# Patient Record
Sex: Female | Born: 1995 | Race: White | Hispanic: No | Marital: Single | Smoking: Current every day smoker
Health system: Southern US, Community
[De-identification: ages and names within clinical notes are randomized; demographics above are authoritative.]

## PROBLEM LIST (undated history)

## (undated) DIAGNOSIS — Z8742 Personal history of other diseases of the female genital tract: Secondary | ICD-10-CM

## (undated) DIAGNOSIS — F4310 Post-traumatic stress disorder, unspecified: Secondary | ICD-10-CM

## (undated) DIAGNOSIS — G40909 Epilepsy, unspecified, not intractable, without status epilepticus: Secondary | ICD-10-CM

## (undated) DIAGNOSIS — Z8619 Personal history of other infectious and parasitic diseases: Secondary | ICD-10-CM

## (undated) DIAGNOSIS — Z87440 Personal history of urinary (tract) infections: Secondary | ICD-10-CM

## (undated) DIAGNOSIS — R569 Unspecified convulsions: Secondary | ICD-10-CM

## (undated) HISTORY — DX: Personal history of other infectious and parasitic diseases: Z86.19

## (undated) HISTORY — DX: Epilepsy, unspecified, not intractable, without status epilepticus: G40.909

## (undated) HISTORY — DX: Personal history of urinary (tract) infections: Z87.440

## (undated) HISTORY — PX: KNEE SURGERY: SHX244

## (undated) HISTORY — DX: Personal history of other diseases of the female genital tract: Z87.42

---

## 2016-08-07 ENCOUNTER — Emergency Department
Admission: EM | Admit: 2016-08-07 | Discharge: 2016-08-07 | Disposition: A | Discharge status: Home / Self Care | Payer: Self-pay | Attending: Emergency Medicine | Admitting: Emergency Medicine

## 2016-08-07 ENCOUNTER — Encounter: Payer: Self-pay | Admitting: Emergency Medicine

## 2016-08-07 DIAGNOSIS — S0501XA Injury of conjunctiva and corneal abrasion without foreign body, right eye, initial encounter: Secondary | ICD-10-CM | POA: Insufficient documentation

## 2016-08-07 DIAGNOSIS — Y999 Unspecified external cause status: Secondary | ICD-10-CM | POA: Insufficient documentation

## 2016-08-07 DIAGNOSIS — H109 Unspecified conjunctivitis: Secondary | ICD-10-CM

## 2016-08-07 DIAGNOSIS — Y939 Activity, unspecified: Secondary | ICD-10-CM | POA: Insufficient documentation

## 2016-08-07 DIAGNOSIS — F17200 Nicotine dependence, unspecified, uncomplicated: Secondary | ICD-10-CM | POA: Insufficient documentation

## 2016-08-07 DIAGNOSIS — S0081XA Abrasion of other part of head, initial encounter: Secondary | ICD-10-CM

## 2016-08-07 DIAGNOSIS — H1089 Other conjunctivitis: Secondary | ICD-10-CM | POA: Insufficient documentation

## 2016-08-07 DIAGNOSIS — W1809XA Striking against other object with subsequent fall, initial encounter: Secondary | ICD-10-CM | POA: Insufficient documentation

## 2016-08-07 DIAGNOSIS — Y929 Unspecified place or not applicable: Secondary | ICD-10-CM | POA: Insufficient documentation

## 2016-08-07 MED ORDER — KETOROLAC TROMETHAMINE 30 MG/ML IJ SOLN
30.0000 mg | Freq: Once | INTRAMUSCULAR | Status: AC
  Administered 2016-08-07: 30 mg via INTRAMUSCULAR
  Filled 2016-08-07: qty 1

## 2016-08-07 MED ORDER — CEPHALEXIN 500 MG PO CAPS
500.0000 mg | ORAL_CAPSULE | Freq: Once | ORAL | Status: AC
  Administered 2016-08-07: 500 mg via ORAL
  Filled 2016-08-07: qty 1

## 2016-08-07 MED ORDER — FLUORESCEIN SODIUM 0.6 MG OP STRP
1.0000 | ORAL_STRIP | Freq: Once | OPHTHALMIC | Status: AC
  Administered 2016-08-07: 1 via OPHTHALMIC
  Filled 2016-08-07: qty 1

## 2016-08-07 MED ORDER — OFLOXACIN 0.3 % OP SOLN: 1.0000 [drp] | Freq: Four times a day (QID) | OPHTHALMIC | 0 refills | Status: AC

## 2016-08-07 MED ORDER — OFLOXACIN 0.3 % OP SOLN
1.0000 [drp] | Freq: Once | OPHTHALMIC | Status: AC
  Administered 2016-08-07: 1 [drp] via OPHTHALMIC
  Filled 2016-08-07: qty 5

## 2016-08-07 MED ORDER — CEPHALEXIN 500 MG PO TABS: 500.0000 mg | ORAL_TABLET | Freq: Four times a day (QID) | ORAL | 0 refills | Status: AC

## 2016-08-07 MED ORDER — TETRACAINE HCL 0.5 % OP SOLN
1.0000 [drp] | Freq: Once | OPHTHALMIC | Status: AC
  Administered 2016-08-07: 1 [drp] via OPHTHALMIC
  Filled 2016-08-07: qty 2
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
## 2016-08-07 NOTE — ED Notes (Signed)
Pharmacy notified to send Ocuflox drops.

## 2016-09-29 ENCOUNTER — Emergency Department
Admission: EM | Admit: 2016-09-29 | Discharge: 2016-09-29 | Disposition: A | Discharge status: Home / Self Care | Payer: Self-pay | Attending: Emergency Medicine | Admitting: Emergency Medicine

## 2016-09-29 ENCOUNTER — Encounter: Payer: Self-pay | Admitting: *Deleted

## 2016-09-29 DIAGNOSIS — E876 Hypokalemia: Secondary | ICD-10-CM | POA: Insufficient documentation

## 2016-09-29 DIAGNOSIS — F17210 Nicotine dependence, cigarettes, uncomplicated: Secondary | ICD-10-CM | POA: Insufficient documentation

## 2016-09-29 DIAGNOSIS — R5383 Other fatigue: Secondary | ICD-10-CM | POA: Insufficient documentation

## 2016-09-29 LAB — URINALYSIS, COMPLETE (UACMP) WITH MICROSCOPIC
BILIRUBIN URINE: NEGATIVE
Bacteria, UA: NONE SEEN
Glucose, UA: NEGATIVE mg/dL
Hgb urine dipstick: NEGATIVE
Ketones, ur: 5 mg/dL — AB
NITRITE: NEGATIVE
PH: 7 (ref 5.0–8.0)
Protein, ur: 100 mg/dL — AB
Specific Gravity, Urine: 1.026 (ref 1.005–1.030)

## 2016-09-29 LAB — BASIC METABOLIC PANEL
Anion gap: 13 (ref 5–15)
BUN: 8 mg/dL (ref 6–20)
CALCIUM: 9.6 mg/dL (ref 8.9–10.3)
CO2: 20 mmol/L — AB (ref 22–32)
CREATININE: 0.82 mg/dL (ref 0.44–1.00)
Chloride: 106 mmol/L (ref 101–111)
Glucose, Bld: 77 mg/dL (ref 65–99)
Potassium: 2.6 mmol/L — CL (ref 3.5–5.1)
SODIUM: 139 mmol/L (ref 135–145)

## 2016-09-29 LAB — CBC
HCT: 41.7 % (ref 35.0–47.0)
Hemoglobin: 14.1 g/dL (ref 12.0–16.0)
MCH: 30.8 pg (ref 26.0–34.0)
MCHC: 33.9 g/dL (ref 32.0–36.0)
MCV: 90.8 fL (ref 80.0–100.0)
PLATELETS: 361 10*3/uL (ref 150–440)
RBC: 4.59 MIL/uL (ref 3.80–5.20)
RDW: 15.3 % — ABNORMAL HIGH (ref 11.5–14.5)
WBC: 10.5 10*3/uL (ref 3.6–11.0)

## 2016-09-29 LAB — MAGNESIUM: Magnesium: 2.1 mg/dL (ref 1.7–2.4)

## 2016-09-29 LAB — POCT PREGNANCY, URINE: PREG TEST UR: NEGATIVE

## 2016-09-29 MED ORDER — POTASSIUM CHLORIDE 10 MEQ/100ML IV SOLN
10.0000 meq | INTRAVENOUS | Status: AC
  Administered 2016-09-29 (×2): 10 meq via INTRAVENOUS
  Filled 2016-09-29 (×2): qty 100

## 2016-09-29 MED ORDER — POTASSIUM CHLORIDE CRYS ER 20 MEQ PO TBCR
40.0000 meq | EXTENDED_RELEASE_TABLET | Freq: Once | ORAL | Status: AC
  Administered 2016-09-29: 40 meq via ORAL
  Filled 2016-09-29: qty 2

## 2016-09-29 NOTE — Discharge Instructions (Addendum)
You were evaluated for muscle cramping, and found to have low potassium. You were given potassium replacement here in the emergency department.  The rest of the exam is reassuring in the emergency department today. Return to the emergency room immediately for any worsening condition including weakness, numbness, slurred speech, trouble finding her words, confusion or altered mental status, fever, neck stiffness, chest pain, trouble breathing, or any other symptoms concerning to you.

## 2016-09-29 NOTE — ED Notes (Signed)
Potassium of 2.6 verbally given to Dr Cyril LoosenKinner. MD acknowledged. No orders given at this time.

## 2016-09-29 NOTE — ED Notes (Signed)
IV placed and full rainbow sent to lab.
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
## 2016-09-30 ENCOUNTER — Encounter: Payer: Self-pay | Admitting: Emergency Medicine

## 2016-09-30 ENCOUNTER — Emergency Department
Admission: EM | Admit: 2016-09-30 | Discharge: 2016-10-01 | Disposition: A | Discharge status: Home / Self Care | Payer: Self-pay | Attending: Emergency Medicine | Admitting: Emergency Medicine

## 2016-09-30 ENCOUNTER — Emergency Department: Payer: Self-pay

## 2016-09-30 DIAGNOSIS — F17210 Nicotine dependence, cigarettes, uncomplicated: Secondary | ICD-10-CM | POA: Insufficient documentation

## 2016-09-30 DIAGNOSIS — F10920 Alcohol use, unspecified with intoxication, uncomplicated: Secondary | ICD-10-CM | POA: Insufficient documentation

## 2016-09-30 DIAGNOSIS — Y9389 Activity, other specified: Secondary | ICD-10-CM | POA: Insufficient documentation

## 2016-09-30 DIAGNOSIS — Y929 Unspecified place or not applicable: Secondary | ICD-10-CM | POA: Insufficient documentation

## 2016-09-30 DIAGNOSIS — S301XXA Contusion of abdominal wall, initial encounter: Secondary | ICD-10-CM | POA: Insufficient documentation

## 2016-09-30 DIAGNOSIS — S0101XA Laceration without foreign body of scalp, initial encounter: Secondary | ICD-10-CM | POA: Insufficient documentation

## 2016-09-30 DIAGNOSIS — Y999 Unspecified external cause status: Secondary | ICD-10-CM | POA: Insufficient documentation

## 2016-09-30 HISTORY — DX: Post-traumatic stress disorder, unspecified: F43.10

## 2016-09-30 LAB — CBC
HEMATOCRIT: 43.3 % (ref 35.0–47.0)
HEMOGLOBIN: 14.4 g/dL (ref 12.0–16.0)
MCH: 31 pg (ref 26.0–34.0)
MCHC: 33.3 g/dL (ref 32.0–36.0)
MCV: 93.2 fL (ref 80.0–100.0)
Platelets: 389 10*3/uL (ref 150–440)
RBC: 4.65 MIL/uL (ref 3.80–5.20)
RDW: 15.8 % — ABNORMAL HIGH (ref 11.5–14.5)
WBC: 11.4 10*3/uL — ABNORMAL HIGH (ref 3.6–11.0)

## 2016-09-30 MED ORDER — LORAZEPAM 2 MG/ML IJ SOLN
1.0000 mg | Freq: Once | INTRAMUSCULAR | Status: AC
Start: 2016-09-30 — End: 2016-09-30
  Administered 2016-09-30: 1 mg via INTRAVENOUS

## 2016-09-30 MED ORDER — IOPAMIDOL (ISOVUE-300) INJECTION 61%
100.0000 mL | Freq: Once | INTRAVENOUS | Status: AC | PRN
  Administered 2016-09-30: 100 mL via INTRAVENOUS

## 2016-09-30 MED ORDER — LORAZEPAM 2 MG/ML IJ SOLN
INTRAMUSCULAR | Status: AC
  Filled 2016-09-30: qty 1

## 2016-09-30 MED ORDER — LORAZEPAM 2 MG/ML IJ SOLN
1.0000 mg | Freq: Once | INTRAMUSCULAR | Status: AC
  Administered 2016-09-30: 1 mg via INTRAVENOUS

## 2016-09-30 MED ORDER — DIPHENHYDRAMINE HCL 50 MG/ML IJ SOLN
INTRAMUSCULAR | Status: AC
  Filled 2016-09-30: qty 1

## 2016-09-30 MED ORDER — HALOPERIDOL LACTATE 5 MG/ML IJ SOLN
INTRAMUSCULAR | Status: AC
  Filled 2016-09-30: qty 1
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
## 2016-10-01 LAB — COMPREHENSIVE METABOLIC PANEL
ALBUMIN: 3.7 g/dL (ref 3.5–5.0)
ALT: 20 U/L (ref 14–54)
ANION GAP: 8 (ref 5–15)
AST: 22 U/L (ref 15–41)
Alkaline Phosphatase: 44 U/L (ref 38–126)
BUN: 8 mg/dL (ref 6–20)
CHLORIDE: 110 mmol/L (ref 101–111)
CO2: 23 mmol/L (ref 22–32)
Calcium: 8.7 mg/dL — ABNORMAL LOW (ref 8.9–10.3)
Creatinine, Ser: 0.74 mg/dL (ref 0.44–1.00)
GFR calc Af Amer: >60 mL/min (ref >60)
GFR calc non Af Amer: >60 mL/min (ref >60)
GLUCOSE: 97 mg/dL (ref 65–99)
POTASSIUM: 3.4 mmol/L — AB (ref 3.5–5.1)
SODIUM: 141 mmol/L (ref 135–145)
Total Bilirubin: 0.2 mg/dL — ABNORMAL LOW (ref 0.3–1.2)
Total Protein: 6.9 g/dL (ref 6.5–8.1)

## 2016-10-01 LAB — ETHANOL: Alcohol, Ethyl (B): 246 mg/dL — ABNORMAL HIGH (ref <5)

## 2016-10-01 MED ORDER — LIDOCAINE HCL (PF) 1 % IJ SOLN
INTRAMUSCULAR | Status: AC
  Administered 2016-10-01: 02:00:00
  Filled 2016-10-01: qty 5

## 2016-10-01 NOTE — ED Notes (Signed)
Cops at bedside

## 2016-10-01 NOTE — ED Notes (Signed)
Pt roommate Tamara Barron(Maria) left phone numbers to call when she is sober enough to leave hospital.  [phone] cell [phone] cell [phone] store after 10am

## 2016-10-17 ENCOUNTER — Encounter: Payer: Self-pay | Admitting: Emergency Medicine

## 2016-10-17 ENCOUNTER — Emergency Department
Admission: EM | Admit: 2016-10-17 | Discharge: 2016-10-17 | Disposition: A | Discharge status: Home / Self Care | Payer: Self-pay | Attending: Emergency Medicine | Admitting: Emergency Medicine

## 2016-10-17 DIAGNOSIS — F17210 Nicotine dependence, cigarettes, uncomplicated: Secondary | ICD-10-CM | POA: Insufficient documentation

## 2016-10-17 DIAGNOSIS — Z4802 Encounter for removal of sutures: Secondary | ICD-10-CM | POA: Insufficient documentation
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
## 2016-10-17 NOTE — ED Triage Notes (Signed)
Pt here for staple removal. Placed 2-2.5 weeks ago. No problems a/w staples. 2 have come out on own per pt.

## 2017-08-29 ENCOUNTER — Emergency Department
Admission: EM | Admit: 2017-08-29 | Discharge: 2017-08-29 | Disposition: A | Discharge status: Home / Self Care | Payer: Self-pay | Attending: Emergency Medicine | Admitting: Emergency Medicine

## 2017-08-29 ENCOUNTER — Encounter: Payer: Self-pay | Admitting: Emergency Medicine

## 2017-08-29 DIAGNOSIS — F1910 Other psychoactive substance abuse, uncomplicated: Secondary | ICD-10-CM | POA: Insufficient documentation

## 2017-08-29 DIAGNOSIS — S61519S Laceration without foreign body of unspecified wrist, sequela: Secondary | ICD-10-CM

## 2017-08-29 DIAGNOSIS — Y999 Unspecified external cause status: Secondary | ICD-10-CM | POA: Insufficient documentation

## 2017-08-29 DIAGNOSIS — X781XXS Intentional self-harm by knife, sequela: Secondary | ICD-10-CM | POA: Insufficient documentation

## 2017-08-29 DIAGNOSIS — Y9389 Activity, other specified: Secondary | ICD-10-CM | POA: Insufficient documentation

## 2017-08-29 DIAGNOSIS — S61512S Laceration without foreign body of left wrist, sequela: Secondary | ICD-10-CM | POA: Insufficient documentation

## 2017-08-29 DIAGNOSIS — Y929 Unspecified place or not applicable: Secondary | ICD-10-CM | POA: Insufficient documentation

## 2017-08-29 DIAGNOSIS — F17210 Nicotine dependence, cigarettes, uncomplicated: Secondary | ICD-10-CM | POA: Insufficient documentation

## 2017-08-29 DIAGNOSIS — Z23 Encounter for immunization: Secondary | ICD-10-CM | POA: Insufficient documentation

## 2017-08-29 DIAGNOSIS — X789XXS Intentional self-harm by unspecified sharp object, sequela: Secondary | ICD-10-CM

## 2017-08-29 DIAGNOSIS — S61511S Laceration without foreign body of right wrist, sequela: Secondary | ICD-10-CM | POA: Insufficient documentation

## 2017-08-29 DIAGNOSIS — R45851 Suicidal ideations: Secondary | ICD-10-CM | POA: Insufficient documentation

## 2017-08-29 HISTORY — DX: Unspecified convulsions: R56.9

## 2017-08-29 LAB — URINE DRUG SCREEN, QUALITATIVE (ARMC ONLY)
Amphetamines, Ur Screen: NOT DETECTED
Benzodiazepine, Ur Scrn: NOT DETECTED
Cannabinoid 50 Ng, Ur ~~LOC~~: POSITIVE — AB
Cocaine Metabolite,Ur ~~LOC~~: POSITIVE — AB
MDMA (Ecstasy)Ur Screen: NOT DETECTED
Methadone Scn, Ur: NOT DETECTED
Opiate, Ur Screen: NOT DETECTED
Phencyclidine (PCP) Ur S: NOT DETECTED
Tricyclic, Ur Screen: NOT DETECTED

## 2017-08-29 LAB — CBC
HEMATOCRIT: 43.9 % (ref 35.0–47.0)
HEMOGLOBIN: 14.6 g/dL (ref 12.0–16.0)
MCH: 31.8 pg (ref 26.0–34.0)
MCHC: 33.3 g/dL (ref 32.0–36.0)
MCV: 95.5 fL (ref 80.0–100.0)
Platelets: 384 10*3/uL (ref 150–440)
RBC: 4.6 MIL/uL (ref 3.80–5.20)
RDW: 14.1 % (ref 11.5–14.5)
WBC: 8.8 10*3/uL (ref 3.6–11.0)

## 2017-08-29 LAB — COMPREHENSIVE METABOLIC PANEL
ALT: 25 U/L (ref 14–54)
AST: 21 U/L (ref 15–41)
Albumin: 4.9 g/dL (ref 3.5–5.0)
Alkaline Phosphatase: 48 U/L (ref 38–126)
Anion gap: 9 (ref 5–15)
BUN: 6 mg/dL (ref 6–20)
CO2: 26 mmol/L (ref 22–32)
Calcium: 9.2 mg/dL (ref 8.9–10.3)
Chloride: 108 mmol/L (ref 101–111)
Creatinine, Ser: 0.84 mg/dL (ref 0.44–1.00)
GFR calc Af Amer: >60 mL/min (ref >60)
GFR calc non Af Amer: >60 mL/min (ref >60)
Glucose, Bld: 104 mg/dL — ABNORMAL HIGH (ref 65–99)
Potassium: 3.5 mmol/L (ref 3.5–5.1)
Sodium: 143 mmol/L (ref 135–145)
Total Bilirubin: 0.3 mg/dL (ref 0.3–1.2)
Total Protein: 8.7 g/dL — ABNORMAL HIGH (ref 6.5–8.1)

## 2017-08-29 LAB — HCG, QUANTITATIVE, PREGNANCY: hCG, Beta Chain, Quant, S: <1 m[IU]/mL (ref <5)

## 2017-08-29 LAB — SALICYLATE LEVEL: Salicylate Lvl: <7 mg/dL (ref 2.8–30.0)

## 2017-08-29 LAB — ETHANOL: Alcohol, Ethyl (B): 221 mg/dL — ABNORMAL HIGH (ref <10)

## 2017-08-29 LAB — ACETAMINOPHEN LEVEL: Acetaminophen (Tylenol), Serum: <10 ug/mL — ABNORMAL LOW (ref 10–30)

## 2017-08-29 MED ORDER — TETANUS-DIPHTH-ACELL PERTUSSIS 5-2.5-18.5 LF-MCG/0.5 IM SUSP
0.5000 mL | Freq: Once | INTRAMUSCULAR | Status: AC
  Administered 2017-08-29: 0.5 mL via INTRAMUSCULAR
  Filled 2017-08-29: qty 0.5

## 2017-08-29 NOTE — ED Notes (Signed)
Pt ambulatory to interview room to have SOC done. Tamara Barron EDT and ODS officer in room with pt. Pt informed about SOC process and verbalized understanding.

## 2017-08-29 NOTE — ED Notes (Addendum)
Pt informed she is being DC. Pt given 1 pt belongings bag back to change in bathroom. States she has a number on her phone to call for sober ride. Will await for ride to arrive before DC pt.

## 2017-08-29 NOTE — BH Assessment (Signed)
This Clinical research associatewriter called ED Dr. Don PerkingVeronese and asked if she wants TTS to see patient and she said no, just psych for clearance.

## 2017-08-29 NOTE — ED Notes (Signed)
SOC complete at this time.

## 2017-08-29 NOTE — ED Notes (Signed)
Pt asking when she can use the phone. Art therapistAmber RN informed pt that she could use a hospital phone at 5 when phone hours are open. She asked about her own phone and informed that policy is that her personal phone is locked away but she would be abel to use a hospital phone. Pt then stated "it's a 25 mile walk to my house."
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
## 2017-08-29 NOTE — ED Notes (Signed)
Dr. Veronese at bedside 

## 2017-08-29 NOTE — ED Notes (Signed)
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 Pt has septum piercing in and nipple rings. States that she paid $50 to have areas pierced and that they can't be removed. States that if we cut them off her then we owe her $50. Art therapistAmber RN explained to pt process of having psych MD evaluate her and that we have one psych MD at this hospital. Pt states "this is bullshit. You'd think a civilian hospital would have more than one doctor." officers at bedside talking to pt as well.
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
## 2017-08-29 NOTE — ED Triage Notes (Signed)
Patient presents to the ED with IVC papers.  Patient states, "I tried to kill myself, but in my defense, I didn't get too far."  Patient is calm and cooperative at this time.

## 2017-08-29 NOTE — ED Notes (Signed)
First Nurse Note: Pt to ED with MPD under IVC. Pt in NAD at this time.

## 2017-08-29 NOTE — ED Provider Notes (Signed)
[facility] Emergency Department Provider Note  ____________________________________________  Time seen: Approximately 5:02 PM  I have reviewed the triage vital signs and the nursing notes.   HISTORY  Chief Complaint Suicidal   HPI Tamara Barron is a 22 y.o. female with a history of seizure and PTSD who presents IVC for suicidal ideation.  According to IVC papers "[place] police officers received a phone call from a female attempting to cut herself with a knife.  When they arrive the responded will not give her name, had several cuts in both arms.  Responded stated to officers that she wanted to die.  Responded stated that she has just been diagnosed with stage IV cancer and only has 2 years to live.  Respondent's boyfriend stated that this was not true that responded lies to get attention from people.  Responded will be transported to [facility] for evaluation."  In the emergency department patient says that she had too much to drink and her emotions got out.  She denies being suicidal.  She denies any prior history of suicidal ideation or suicide attempts.  She denies any prior admission to psychiatric facilities.  She  denies history of depression. She reports that she drinks on a daily basis.  She denies any other drug use.   Chief Complaint: suicidal ideation Severity: severe Duration: today Context: in the setting of alcohol abuse Modifying factors: worse with drinking Associated signs/symptoms: patient denies HI or depression  Past Medical History:  Diagnosis Date  . PTSD (post-traumatic stress disorder)   . Seizures (HCC)    epilepsy    Past Surgical History:  Procedure Laterality Date  . KNEE SURGERY      Prior to Admission medications   Not on File    Allergies Hydrocodone and Oxycodone  FH None  Social History Social History   Tobacco Use  . Smoking status: Current Every Day Smoker    Packs/day: 0.50    Types: Cigarettes   . Smokeless tobacco: Never Used  Substance Use Topics  . Alcohol use: Yes    Comment: daily--"a lot"-a 5th probably  . Drug use: No    Review of Systems  Constitutional: Negative for fever. Eyes: Negative for visual changes. ENT: Negative for sore throat. Neck: No neck pain  Cardiovascular: Negative for chest pain. Respiratory: Negative for shortness of breath. Gastrointestinal: Negative for abdominal pain, vomiting or diarrhea. Genitourinary: Negative for dysuria. Musculoskeletal: Negative for back pain. Skin: Negative for rash. Neurological: Negative for headaches, weakness or numbness. Psych: + SI. No HI  ____________________________________________   PHYSICAL EXAM:  VITAL SIGNS: ED Triage Vitals  Enc Vitals Group     BP 08/29/17 1535 134/89     Pulse Rate 08/29/17 1535 95     Resp 08/29/17 1535 20     Temp 08/29/17 1535 98.3 F (36.8 C)     Temp Source 08/29/17 1535 Oral     SpO2 08/29/17 1535 100 %     Weight 08/29/17 1600 168 lb (76.2 kg)     Height 08/29/17 1600 5' 6.5" (1.689 m)     Head Circumference --      Peak Flow --      Pain Score 08/29/17 1600 0     Pain Loc --      Pain Edu? --      Excl. in GC? --     Constitutional: Alert and oriented. Well appearing and in no apparent distress. HEENT:  Head: Normocephalic and atraumatic.         Eyes: Conjunctivae are normal. Sclera is non-icteric.       Mouth/Throat: Mucous membranes are moist.       Neck: Supple with no signs of meningismus. Cardiovascular: Regular rate and rhythm. No murmurs, gallops, or rubs. 2+ symmetrical distal pulses are present in all extremities. No JVD. Respiratory: Normal respiratory effort. Lungs are clear to auscultation bilaterally. No wheezes, crackles, or rhonchi.  Gastrointestinal: Soft, non tender, and non distended with positive bowel sounds. No rebound or guarding. Musculoskeletal: Nontender with normal range of motion in all extremities. No edema, cyanosis, or  erythema of extremities. Neurologic: Normal speech and language. Face is symmetric. Moving all extremities. No gross focal neurologic deficits are appreciated. Skin: Skin is warm, dry and intact. No rash noted. Superficial lacerations of bilateral wrists Psychiatric: Mood and affect are blunt. Speech and behavior are normal.  ____________________________________________   LABS (all labs ordered are listed, but only abnormal results are displayed)  Labs Reviewed  COMPREHENSIVE METABOLIC PANEL - Abnormal; Notable for the following components:      Result Value   Glucose, Bld 104 (*)    Total Protein 8.7 (*)    All other components within normal limits  ETHANOL - Abnormal; Notable for the following components:   Alcohol, Ethyl (B) 221 (*)    All other components within normal limits  ACETAMINOPHEN LEVEL - Abnormal; Notable for the following components:   Acetaminophen (Tylenol), Serum <10 (*)    All other components within normal limits  URINE DRUG SCREEN, QUALITATIVE ([facility] ONLY) - Abnormal; Notable for the following components:   Cocaine Metabolite,Ur [place] POSITIVE (*)    Cannabinoid 50 Ng, Ur [place] POSITIVE (*)    Barbiturates, Ur Screen   (*)    Value: Result not available. Reagent lot number recalled by manufacturer.   All other components within normal limits  SALICYLATE LEVEL  CBC  HCG, QUANTITATIVE, PREGNANCY  POC URINE PREG, ED   ____________________________________________  EKG  none  ____________________________________________  RADIOLOGY  none  ____________________________________________   PROCEDURES  Procedure(s) performed: None Procedures Critical Care performed:  None ____________________________________________   INITIAL IMPRESSION / ASSESSMENT AND PLAN / ED COURSE   22 y.o. female with a history of seizure and PTSD who presents IVC for suicidal ideation, patient was holding a knife in the setting of alcohol abuse.  Told police she wanted to die.  She  has superficial lacerations on bilateral wrists.  At this time patient is denying any suicidal ideation and reports that she was just upset after drinking alcohol.  Will consult psychiatry for evaluation.  In the meantime we will keep patient IVC.  Labs show alcohol level of 221, and tox screen positive for cocaine and cannabinoid. No other lab abnormalities.     _________________________ 6:45 PM on 08/29/2017 -----------------------------------------  Patient has been evaluated by Dr. Rayburn Felt, psychiatrist who lifted patient's IVC and cleared patient for discharge. Patient continues to deny any SI at this time. Patient waiting for sober ride for dc home.   As part of my medical decision making, I reviewed the following data within the electronic MEDICAL RECORD NUMBER Nursing notes reviewed and incorporated, Labs reviewed , A consult was requested and obtained from this/these consultant(s) Psychiatry, Notes from prior ED visits and [place] Controlled Substance Database    Pertinent labs & imaging results that were available during my care of the patient were reviewed by me and considered in my  medical decision making (see chart for details).    ____________________________________________   FINAL CLINICAL IMPRESSION(S) / ED DIAGNOSES  Final diagnoses:  Suicidal ideation  Polysubstance abuse (HCC)  Self-inflicted laceration of wrist, sequela      NEW MEDICATIONS STARTED DURING THIS VISIT:  ED Discharge Orders    None       Note:  This document was prepared using Dragon voice recognition software and may include unintentional dictation errors.    Don Perking, [place], MD 08/29/17 [phone]

## 2017-08-29 NOTE — ED Notes (Signed)
Patient states, "I'm not taking my nipple piercings out and I'm not taking my septum out--it has not be removed by a tattoo artist."  Patient states, you guys can try to take them out but they wont' come out."--Patient's RN notified.

## 2017-08-29 NOTE — ED Notes (Signed)
Pt given phone to make a phone call 

## 2017-10-04 ENCOUNTER — Emergency Department
Admission: EM | Admit: 2017-10-04 | Discharge: 2017-10-04 | Disposition: A | Discharge status: Home / Self Care | Payer: Self-pay | Attending: Emergency Medicine | Admitting: Emergency Medicine

## 2017-10-04 ENCOUNTER — Encounter: Payer: Self-pay | Admitting: Emergency Medicine

## 2017-10-04 ENCOUNTER — Emergency Department: Payer: Self-pay

## 2017-10-04 DIAGNOSIS — Z3202 Encounter for pregnancy test, result negative: Secondary | ICD-10-CM | POA: Insufficient documentation

## 2017-10-04 DIAGNOSIS — N938 Other specified abnormal uterine and vaginal bleeding: Secondary | ICD-10-CM | POA: Insufficient documentation

## 2017-10-04 DIAGNOSIS — R102 Pelvic and perineal pain: Secondary | ICD-10-CM | POA: Insufficient documentation

## 2017-10-04 DIAGNOSIS — N939 Abnormal uterine and vaginal bleeding, unspecified: Secondary | ICD-10-CM

## 2017-10-04 DIAGNOSIS — F17210 Nicotine dependence, cigarettes, uncomplicated: Secondary | ICD-10-CM | POA: Insufficient documentation

## 2017-10-04 LAB — POCT PREGNANCY, URINE: Preg Test, Ur: NEGATIVE

## 2017-10-04 MED ORDER — MEDROXYPROGESTERONE ACETATE 10 MG PO TABS: 10.0000 mg | ORAL_TABLET | Freq: Every day | ORAL | 0 refills | Status: DC

## 2017-10-04 NOTE — ED Notes (Signed)
Pt taken to US

## 2017-10-04 NOTE — ED Provider Notes (Signed)
[facility] Emergency Department Provider Note       Time seen: ----------------------------------------- 3:41 PM on 10/04/2017 -----------------------------------------   I have reviewed the triage vital signs and the nursing notes.  HISTORY   Chief Complaint Vaginal Bleeding    HPI Tamara Barron is a 22 y.o. female with a history of PTSD and seizures who presents to the ED for abnormal vaginal bleeding.  Patient reports vaginal bleeding that began yesterday, that she has used 3-4 pads per day for the past 2 days and this is unusual because this is not the correct time for her menses.  She reports intermittent lower abdominal pain as well.  She is not trying to prevent pregnancy, is not sure if she is pregnant.  Past Medical History:  Diagnosis Date  . PTSD (post-traumatic stress disorder)   . Seizures (HCC)    epilepsy    There are no active problems to display for this patient.   Past Surgical History:  Procedure Laterality Date  . KNEE SURGERY      Allergies Hydrocodone and Oxycodone  Social History Social History   Tobacco Use  . Smoking status: Current Every Day Smoker    Packs/day: 0.50    Types: Cigarettes  . Smokeless tobacco: Never Used  Substance Use Topics  . Alcohol use: Yes    Comment: daily--"a lot"-a 5th probably  . Drug use: No   Review of Systems Constitutional: Negative for fever. Cardiovascular: Negative for chest pain. Respiratory: Negative for shortness of breath. Gastrointestinal: Positive for lower abdominal pain Genitourinary: Positive for vaginal bleeding Musculoskeletal: Negative for back pain. Skin: Negative for rash. Neurological: Negative for headaches, focal weakness or numbness.  All systems negative/normal/unremarkable except as stated in the HPI  ____________________________________________   PHYSICAL EXAM:  VITAL SIGNS: ED Triage Vitals  Enc Vitals Group     BP 10/04/17 1536 (!) 130/91      Pulse Rate 10/04/17 1536 86     Resp 10/04/17 1536 16     Temp 10/04/17 1536 98.5 F (36.9 C)     Temp Source 10/04/17 1536 Oral     SpO2 10/04/17 1536 100 %     Weight 10/04/17 1526 163 lb (73.9 kg)     Height 10/04/17 1526 5\' 7"  (1.702 m)     Head Circumference --      Peak Flow --      Pain Score 10/04/17 1526 4     Pain Loc --      Pain Edu? --      Excl. in GC? --    Constitutional: Alert and oriented. Well appearing and in no distress. Eyes: Conjunctivae are normal. Normal extraocular movements. Cardiovascular: Normal rate, regular rhythm. No murmurs, rubs, or gallops. Respiratory: Normal respiratory effort without tachypnea nor retractions. Breath sounds are clear and equal bilaterally. No wheezes/rales/rhonchi. Gastrointestinal: Soft and nontender. Normal bowel sounds Musculoskeletal: Nontender with normal range of motion in extremities. No lower extremity tenderness nor edema. Neurologic:  Normal speech and language. No gross focal neurologic deficits are appreciated.  Skin:  Skin is warm, dry and intact. No rash noted. Psychiatric: Mood and affect are normal. Speech and behavior are normal.  ____________________________________________  ED COURSE:  As part of my medical decision making, I reviewed the following data within the electronic MEDICAL RECORD NUMBER History obtained from family if available, nursing notes, old chart and ekg, as well as notes from prior ED visits. Patient presented for vaginal bleeding, we will assess with  labs and imaging as indicated at this time.   Procedures ____________________________________________   LABS (pertinent positives/negatives)  Labs Reviewed  POC URINE PREG, ED  POCT PREGNANCY, URINE    RADIOLOGY  Pelvic ultrasound IMPRESSION: Normal pelvic ultrasound. No adnexal mass or free fluid. No evidence of ovarian torsion.   ____________________________________________  DIFFERENTIAL DIAGNOSIS   Abnormal vaginal  bleeding, ectopic pregnancy, ovarian cyst, normal menses  FINAL ASSESSMENT AND PLAN  Abnormal vaginal bleeding   Plan: The patient had presented for abnormal vaginal bleeding. Patient's labs were negative for pregnancy. Patient's imaging not reveal any acute process.  She will be placed on Provera and referred to GYN for outpatient follow-up.   Ulice DashJohnathan E Sofi Bryars, MD   Note: This note was generated in part or whole with voice recognition software. Voice recognition is usually quite accurate but there are transcription errors that can and very often do occur. I apologize for any typographical errors that were not detected and corrected.     Emily FilbertWilliams, Amirra Herling E, MD 10/04/17 [phone]

## 2017-10-04 NOTE — ED Notes (Signed)
AAOx3.  Skin warm and dry.  NAD 

## 2017-10-04 NOTE — ED Triage Notes (Addendum)
Patient reports vaginal bleeding that began yesterday.  Patient states she has used 3-4 pads per day for the past 2 days and states this is unusual because it is not the correct time for her period.  Patient reports intermittent lower abdominal pain as well.  Patient is in no obvious distress at this time.

## 2018-02-02 ENCOUNTER — Emergency Department
Admission: EM | Admit: 2018-02-02 | Discharge: 2018-02-02 | Disposition: A | Discharge status: Home / Self Care | Payer: Self-pay | Attending: Emergency Medicine | Admitting: Emergency Medicine

## 2018-02-02 ENCOUNTER — Encounter: Payer: Self-pay | Admitting: Emergency Medicine

## 2018-02-02 DIAGNOSIS — N730 Acute parametritis and pelvic cellulitis: Secondary | ICD-10-CM | POA: Insufficient documentation

## 2018-02-02 DIAGNOSIS — F17210 Nicotine dependence, cigarettes, uncomplicated: Secondary | ICD-10-CM | POA: Insufficient documentation

## 2018-02-02 LAB — WET PREP, GENITAL
Sperm: NONE SEEN
TRICH WET PREP: NONE SEEN
YEAST WET PREP: NONE SEEN

## 2018-02-02 LAB — URINALYSIS, COMPLETE (UACMP) WITH MICROSCOPIC
Bacteria, UA: NONE SEEN
Bilirubin Urine: NEGATIVE
Glucose, UA: NEGATIVE mg/dL
Hgb urine dipstick: NEGATIVE
Ketones, ur: 20 mg/dL — AB
NITRITE: NEGATIVE
PH: 6 (ref 5.0–8.0)
Protein, ur: 100 mg/dL — AB
SPECIFIC GRAVITY, URINE: 1.033 — AB (ref 1.005–1.030)
Squamous Epithelial / LPF: >50 — ABNORMAL HIGH (ref 0–5)

## 2018-02-02 LAB — CBC
HCT: 44.7 % (ref 36.0–46.0)
Hemoglobin: 14.9 g/dL (ref 12.0–15.0)
MCH: 30.8 pg (ref 26.0–34.0)
MCHC: 33.3 g/dL (ref 30.0–36.0)
MCV: 92.4 fL (ref 80.0–100.0)
NRBC: 0 % (ref 0.0–0.2)
PLATELETS: 343 10*3/uL (ref 150–400)
RBC: 4.84 MIL/uL (ref 3.87–5.11)
RDW: 13.9 % (ref 11.5–15.5)
WBC: 13.7 10*3/uL — AB (ref 4.0–10.5)

## 2018-02-02 LAB — COMPREHENSIVE METABOLIC PANEL
ALBUMIN: 4.7 g/dL (ref 3.5–5.0)
ALT: 23 U/L (ref 0–44)
ANION GAP: 14 (ref 5–15)
AST: 26 U/L (ref 15–41)
Alkaline Phosphatase: 57 U/L (ref 38–126)
BILIRUBIN TOTAL: 0.8 mg/dL (ref 0.3–1.2)
BUN: 12 mg/dL (ref 6–20)
CALCIUM: 9.8 mg/dL (ref 8.9–10.3)
CO2: 23 mmol/L (ref 22–32)
CREATININE: 0.76 mg/dL (ref 0.44–1.00)
Chloride: 99 mmol/L (ref 98–111)
GFR calc Af Amer: >60 mL/min (ref >60)
GFR calc non Af Amer: >60 mL/min (ref >60)
Glucose, Bld: 97 mg/dL (ref 70–99)
POTASSIUM: 3.3 mmol/L — AB (ref 3.5–5.1)
SODIUM: 136 mmol/L (ref 135–145)
Total Protein: 8.1 g/dL (ref 6.5–8.1)

## 2018-02-02 LAB — CHLAMYDIA/NGC RT PCR (ARMC ONLY)
CHLAMYDIA TR: NOT DETECTED
N GONORRHOEAE: NOT DETECTED

## 2018-02-02 LAB — LIPASE, BLOOD: Lipase: 29 U/L (ref 11–51)

## 2018-02-02 LAB — POC URINE PREG, ED: Preg Test, Ur: NEGATIVE

## 2018-02-02 MED ORDER — OXYCODONE-ACETAMINOPHEN 5-325 MG PO TABS
2.0000 | ORAL_TABLET | Freq: Once | ORAL | Status: AC
  Administered 2018-02-02: 1 via ORAL
  Filled 2018-02-02: qty 2

## 2018-02-02 MED ORDER — OXYCODONE-ACETAMINOPHEN 5-325 MG PO TABS: 1.0000 | ORAL_TABLET | Freq: Three times a day (TID) | ORAL | 0 refills | Status: DC | PRN

## 2018-02-02 MED ORDER — ONDANSETRON 4 MG PO TBDP
4.0000 mg | ORAL_TABLET | Freq: Once | ORAL | Status: AC
  Administered 2018-02-02: 4 mg via ORAL

## 2018-02-02 MED ORDER — LIDOCAINE HCL (PF) 1 % IJ SOLN
5.0000 mL | Freq: Once | INTRAMUSCULAR | Status: AC
Start: 2018-02-02 — End: 2018-02-02
  Administered 2018-02-02: 5 mL via INTRADERMAL

## 2018-02-02 MED ORDER — ONDANSETRON 4 MG PO TBDP
ORAL_TABLET | ORAL | Status: AC
  Filled 2018-02-02: qty 1

## 2018-02-02 MED ORDER — LIDOCAINE HCL (PF) 1 % IJ SOLN
INTRAMUSCULAR | Status: AC
  Filled 2018-02-02: qty 5

## 2018-02-02 MED ORDER — DOXYCYCLINE HYCLATE 100 MG PO CAPS: 100.0000 mg | ORAL_CAPSULE | Freq: Two times a day (BID) | ORAL | 0 refills | Status: DC

## 2018-02-02 MED ORDER — CEFTRIAXONE SODIUM 250 MG IJ SOLR
250.0000 mg | INTRAMUSCULAR | Status: DC
  Administered 2018-02-02: 250 mg via INTRAMUSCULAR
  Filled 2018-02-02: qty 250

## 2018-02-02 MED ORDER — AZITHROMYCIN 500 MG PO TABS
1000.0000 mg | ORAL_TABLET | Freq: Once | ORAL | Status: AC
  Administered 2018-02-02: 1000 mg via ORAL
  Filled 2018-02-02: qty 2

## 2018-02-02 MED ORDER — METRONIDAZOLE 500 MG PO TABS: 500.0000 mg | ORAL_TABLET | Freq: Three times a day (TID) | ORAL | 0 refills | Status: DC

## 2018-02-02 NOTE — ED Provider Notes (Signed)
[facility] Emergency Department Provider Note       Time seen: ----------------------------------------- 2:46 PM on 02/02/2018 -----------------------------------------   I have reviewed the triage vital signs and the nursing notes.  HISTORY   Chief Complaint Abdominal Pain    HPI Tamara Barron is a 22 y.o. female with a history of PTSD, seizures and epilepsy who presents to the ED for lower abdominal and pelvic pain since yesterday.  Patient states that was a yeast infection but now the pain is worse.  She denies any fevers, chills, vomiting or diarrhea.  States she is not using any prevention for pregnancy.  Past Medical History:  Diagnosis Date  . PTSD (post-traumatic stress disorder)   . Seizures (HCC)    epilepsy    There are no active problems to display for this patient.   Past Surgical History:  Procedure Laterality Date  . KNEE SURGERY      Allergies Patient has no active allergies.  Social History Social History   Tobacco Use  . Smoking status: Current Every Day Smoker    Packs/day: 0.50    Types: Cigarettes  . Smokeless tobacco: Never Used  Substance Use Topics  . Alcohol use: Yes    Comment: daily--"a lot"-a 5th probably  . Drug use: No   Review of Systems Constitutional: Negative for fever. Cardiovascular: Negative for chest pain. Respiratory: Negative for shortness of breath. Gastrointestinal: Positive for diffuse lower abdominal pain Genitourinary: Positive for pelvic pain and vaginal discharge Musculoskeletal: Negative for back pain. Skin: Negative for rash. Neurological: Negative for headaches, focal weakness or numbness.  All systems negative/normal/unremarkable except as stated in the HPI  ____________________________________________   PHYSICAL EXAM:  VITAL SIGNS: ED Triage Vitals  Enc Vitals Group     BP 02/02/18 1430 (!) 125/91     Pulse Rate 02/02/18 1430 (!) 115     Resp 02/02/18 1430 (!) 22    Temp 02/02/18 1430 98.7 F (37.1 C)     Temp Source 02/02/18 1430 Oral     SpO2 02/02/18 1430 100 %     Weight 02/02/18 1433 162 lb (73.5 kg)     Height 02/02/18 1433 5\' 7"  (1.702 m)     Head Circumference --      Peak Flow --      Pain Score 02/02/18 1433 9     Pain Loc --      Pain Edu? --      Excl. in GC? --    Constitutional: Alert and oriented.  Mild distress Eyes: Conjunctivae are normal. Normal extraocular movements. ENT   Head: Normocephalic and atraumatic.   Nose: No congestion/rhinnorhea.   Mouth/Throat: Mucous membranes are moist.   Neck: No stridor. Cardiovascular: Normal rate, regular rhythm. No murmurs, rubs, or gallops. Respiratory: Normal respiratory effort without tachypnea nor retractions. Breath sounds are clear and equal bilaterally. No wheezes/rales/rhonchi. Gastrointestinal: Diffuse lower abdominal tenderness, no rebound or guarding.  Normal bowel sounds. Musculoskeletal: Nontender with normal range of motion in extremities. No lower extremity tenderness nor edema. Neurologic:  Normal speech and language. No gross focal neurologic deficits are appreciated.  Skin:  Skin is warm, dry and intact. No rash noted. ____________________________________________  ED COURSE:  As part of my medical decision making, I reviewed the following data within the electronic MEDICAL RECORD NUMBER History obtained from family if available, nursing notes, old chart and ekg, as well as notes from prior ED visits. Patient presented for pelvic pain, we will assess with  labs and imaging as indicated at this time.   Procedures ____________________________________________   LABS (pertinent positives/negatives)  Labs Reviewed  WET PREP, GENITAL - Abnormal; Notable for the following components:      Result Value   Clue Cells Wet Prep HPF POC PRESENT (*)    WBC, Wet Prep HPF POC MODERATE (*)    All other components within normal limits  COMPREHENSIVE METABOLIC PANEL -  Abnormal; Notable for the following components:   Potassium 3.3 (*)    All other components within normal limits  CBC - Abnormal; Notable for the following components:   WBC 13.7 (*)    All other components within normal limits  URINALYSIS, COMPLETE (UACMP) WITH MICROSCOPIC - Abnormal; Notable for the following components:   Color, Urine AMBER (*)    APPearance TURBID (*)    Specific Gravity, Urine 1.033 (*)    Ketones, ur 20 (*)    Protein, ur 100 (*)    Leukocytes, UA MODERATE (*)    Squamous Epithelial / LPF >50 (*)    All other components within normal limits  CHLAMYDIA/NGC RT PCR ([facility] ONLY)  LIPASE, BLOOD  POC URINE PREG, ED   ____________________________________________  DIFFERENTIAL DIAGNOSIS   PID, UTI, cervicitis, ovarian cyst, appendicitis  FINAL ASSESSMENT AND PLAN  Pelvic inflammatory disease   Plan: The patient had presented for pelvic pain and discharge. Patient's labs revealed leukocytosis as well as urinary tract abnormalities although she does not have any new urinary symptoms.  Patient clinically has PID.  We gave Rocephin and Zithromax here.  She will be discharged with doxycycline and Flagyl as well as pain medicine.  She is cleared for outpatient follow-up.   Ulice Dash, MD   Note: This note was generated in part or whole with voice recognition software. Voice recognition is usually quite accurate but there are transcription errors that can and very often do occur. I apologize for any typographical errors that were not detected and corrected.     Emily Filbert, MD 02/02/18 [phone]

## 2018-02-02 NOTE — ED Triage Notes (Signed)
Patient presents to the ED with lower abdominal and pelvic pain since yesterday.   Patient states, "I thought I had a yeast infection but now the pain is worse."  Patient denies vomiting and diarrhea.

## 2018-02-04 NOTE — ED Notes (Signed)
Patient called asking for std results.  Gave her results. 

## 2018-04-29 ENCOUNTER — Other Ambulatory Visit: Payer: Self-pay

## 2018-04-29 ENCOUNTER — Encounter: Payer: Self-pay | Admitting: Emergency Medicine

## 2018-04-29 ENCOUNTER — Emergency Department
Admission: EM | Admit: 2018-04-29 | Discharge: 2018-04-29 | Disposition: A | Discharge status: Home / Self Care | Payer: Self-pay | Attending: Emergency Medicine | Admitting: Emergency Medicine

## 2018-04-29 DIAGNOSIS — F17210 Nicotine dependence, cigarettes, uncomplicated: Secondary | ICD-10-CM | POA: Insufficient documentation

## 2018-04-29 DIAGNOSIS — J069 Acute upper respiratory infection, unspecified: Secondary | ICD-10-CM | POA: Insufficient documentation

## 2018-04-29 DIAGNOSIS — Z3202 Encounter for pregnancy test, result negative: Secondary | ICD-10-CM | POA: Insufficient documentation

## 2018-04-29 LAB — POCT PREGNANCY, URINE: Preg Test, Ur: NEGATIVE

## 2018-04-29 MED ORDER — OXYMETAZOLINE HCL 0.05 % NA SOLN: 2.0000 | Freq: Two times a day (BID) | NASAL | 0 refills | Status: AC

## 2018-04-29 NOTE — ED Triage Notes (Signed)
Patient reports nasal congestion and body aches x3 days. States intermittent fever at home. Patient reports she had episode of vomiting yesterday after drinking a large amount of cold water.

## 2018-04-29 NOTE — ED Provider Notes (Signed)
[facility] Emergency Department Provider Note  ____________________________________________  Time seen: Approximately 1:58 PM  I have reviewed the triage vital signs and the nursing notes.   HISTORY  Chief Complaint Nasal Congestion and Fever    HPI Tamara Barron is a 23 y.o. female that presents to the emergency department for evaluation of nasal congestion, sore throat, cough for 4 days.  Patient had 3 episodes of vomiting yesterday.  She had chills yesterday but none today.  Sore throat has improved today.  She did cough up some phlegm yesterday but not has not coughed up anything today.  She has eaten and drink this morning without any vomiting.  She states that she did make some chicken Parmesan that she had pulled out of the freezer and saves food and bulk and thought it looked okay but was possible that it was old.  She states that she does not know if she is pregnant but is not using any protection.  She was not going to come today but her boss stated that she needed a note to return to work.  She has been taking lavender Epson salt baths.   Past Medical History:  Diagnosis Date  . PTSD (post-traumatic stress disorder)   . Seizures ([facility])    epilepsy    There are no active problems to display for this patient.   Past Surgical History:  Procedure Laterality Date  . KNEE SURGERY      Prior to Admission medications   Medication Sig Start Date End Date Taking? Authorizing Provider  oxymetazoline (AFRIN) 0.05 % nasal spray Place 2 sprays into both nostrils 2 (two) times daily for 3 days. 04/29/18 05/02/18  Enid Derry, PA-C    Allergies Patient has no known allergies.  No family history on file.  Social History Social History   Tobacco Use  . Smoking status: Current Every Day Smoker    Packs/day: 0.50    Types: Cigarettes  . Smokeless tobacco: Never Used  Substance Use Topics  . Alcohol use: Yes    Comment: daily--"a lot"-a 5th  probably  . Drug use: No     Review of Systems  Constitutional: No fever/chills Eyes: No visual changes. No discharge. ENT: Positive for congestion and rhinorrhea. Cardiovascular: No chest pain. Respiratory: Positive for cough. No SOB. Gastrointestinal: No abdominal pain.  No nausea, no vomiting.  No diarrhea.  No constipation. Musculoskeletal: Negative for musculoskeletal pain. Skin: Negative for rash, abrasions, lacerations, ecchymosis. Neurological: Negative for headaches.   ____________________________________________   PHYSICAL EXAM:  VITAL SIGNS: ED Triage Vitals [04/29/18 1324]  Enc Vitals Group     BP 123/75     Pulse Rate 82     Resp 16     Temp 97.7 F (36.5 C)     Temp Source Oral     SpO2 99 %     Weight 160 lb (72.6 kg)     Height 5\' 6"  (1.676 m)     Head Circumference      Peak Flow      Pain Score 0     Pain Loc      Pain Edu?      Excl. in GC?      Constitutional: Alert and oriented. Well appearing and in no acute distress. Eyes: Conjunctivae are normal. PERRL. EOMI. No discharge. Head: Atraumatic. ENT: No frontal and maxillary sinus tenderness.      Ears: Tympanic membranes pearly gray with good landmarks. No discharge.  Nose: Mild congestion/rhinnorhea.      Mouth/Throat: Mucous membranes are moist. Oropharynx non-erythematous. Tonsils not enlarged. No exudates. Uvula midline. Neck: No stridor.   Hematological/Lymphatic/Immunilogical: No cervical lymphadenopathy. Cardiovascular: Normal rate, regular rhythm.  Good peripheral circulation. Respiratory: Normal respiratory effort without tachypnea or retractions. Lungs CTAB. Good air entry to the bases with no decreased or absent breath sounds. Gastrointestinal: Bowel sounds 4 quadrants. Soft and nontender to palpation. No guarding or rigidity. No palpable masses. No distention. Musculoskeletal: Full range of motion to all extremities. No gross deformities appreciated. Neurologic:  Normal  speech and language. No gross focal neurologic deficits are appreciated.  Skin:  Skin is warm, dry and intact. No rash noted. Psychiatric: Mood and affect are normal. Speech and behavior are normal. Patient exhibits appropriate insight and judgement.   ____________________________________________   LABS (all labs ordered are listed, but only abnormal results are displayed)  Labs Reviewed  POCT PREGNANCY, URINE  POC URINE PREG, ED   ____________________________________________  EKG   ____________________________________________  RADIOLOGY   No results found.  ____________________________________________    PROCEDURES  Procedure(s) performed:    Procedures    Medications - No data to display   ____________________________________________   INITIAL IMPRESSION / ASSESSMENT AND PLAN / ED COURSE  Pertinent labs & imaging results that were available during my care of the patient were reviewed by me and considered in my medical decision making (see chart for details).  Review of the [place] CSRS was performed in accordance of the [facility] prior to dispensing any controlled drugs.   Patient's diagnosis is consistent with viral URI. Vital signs and exam are reassuring.  Symptoms are improving.  Pregnancy test is negative.  Patient appears well and is staying well hydrated. Patient should alternate tylenol and ibuprofen for fever. Patient feels comfortable going home. Patient will be discharged home with prescriptions for Afrin. Patient is to follow up with primary care as needed or otherwise directed. Patient is given ED precautions to return to the ED for any worsening or new symptoms.     ____________________________________________  FINAL CLINICAL IMPRESSION(S) / ED DIAGNOSES  Final diagnoses:  Viral URI      NEW MEDICATIONS STARTED DURING THIS VISIT:  ED Discharge Orders         Ordered    oxymetazoline (AFRIN) 0.05 % nasal spray  2 times daily     04/29/18  1437              This chart was dictated using voice recognition software/Dragon. Despite best efforts to proofread, errors can occur which can change the meaning. Any change was purely unintentional.    Enid Derry, PA-C 04/29/18 1807    Minna Antis, MD 04/30/18 [phone]

## 2018-04-29 NOTE — ED Notes (Signed)
See triage note  Presents with cough and nasal congestion  Sx's started about 3 days ago   Also subjective fever but afebrile on arrival

## 2018-09-20 ENCOUNTER — Other Ambulatory Visit: Payer: Self-pay

## 2018-09-20 ENCOUNTER — Other Ambulatory Visit: Payer: Self-pay | Admitting: Family Medicine

## 2018-09-20 ENCOUNTER — Ambulatory Visit (LOCAL_COMMUNITY_HEALTH_CENTER): Payer: Self-pay

## 2018-09-20 VITALS — BP 103/72 | Ht 67.0 in | Wt 168.5 lb

## 2018-09-20 DIAGNOSIS — Z3201 Encounter for pregnancy test, result positive: Secondary | ICD-10-CM

## 2018-09-20 LAB — PREGNANCY, URINE: Preg Test, Ur: POSITIVE — AB

## 2018-09-28 NOTE — Progress Notes (Signed)
Shyla Jaffee presents for [facility] nurse interview visit.LMP 07/23/18. Pregnancy confirmation done 09/20/18 [facility].  G-2 .  P- 1 0 0 1   . Pregnancy education material explained and given. _0__ cats in the home. NOB labs ordered. (TSH/HbgA1c due to Increased BMI). Drug screen and HIV declined. PNV encouraged. Genetic screening options discussed. Genetic testing: Ordered at NOB PE visit.Marland Kitchen  Pt may discuss with provider. Pt. To follow up with provider in 2 weeks for NOB physical. Schedule U/S for dating and viability in 1 week.  All questions answered.

## 2018-09-30 ENCOUNTER — Other Ambulatory Visit: Payer: Self-pay

## 2018-09-30 ENCOUNTER — Ambulatory Visit: Payer: Self-pay | Admitting: Certified Nurse Midwife

## 2018-09-30 VITALS — BP 106/71 | HR 90 | Ht 67.0 in | Wt 164.1 lb

## 2018-09-30 DIAGNOSIS — N912 Amenorrhea, unspecified: Secondary | ICD-10-CM

## 2018-09-30 LAB — OB RESULTS CONSOLE VARICELLA ZOSTER ANTIBODY, IGG: Varicella: NON-IMMUNE/NOT IMMUNE

## 2018-09-30 LAB — OB RESULTS CONSOLE GC/CHLAMYDIA: Gonorrhea: NEGATIVE

## 2018-10-01 LAB — URINALYSIS, ROUTINE W REFLEX MICROSCOPIC
Bilirubin, UA: NEGATIVE
Glucose, UA: NEGATIVE
Nitrite, UA: NEGATIVE
RBC, UA: NEGATIVE
Specific Gravity, UA: 1.025 (ref 1.005–1.030)
Urobilinogen, Ur: 0.2 mg/dL (ref 0.2–1.0)
pH, UA: 6 (ref 5.0–7.5)

## 2018-10-01 LAB — MICROSCOPIC EXAMINATION
Casts: NONE SEEN /lpf
Epithelial Cells (non renal): >10 /hpf — AB (ref 0–10)

## 2018-10-01 LAB — ABO AND RH: Rh Factor: POSITIVE

## 2018-10-01 LAB — VARICELLA ZOSTER ANTIBODY, IGG: Varicella zoster IgG: <135 index — ABNORMAL LOW (ref >165)

## 2018-10-01 LAB — RUBELLA SCREEN: Rubella Antibodies, IGG: 1.74 index (ref >0.99)

## 2018-10-01 LAB — ANTIBODY SCREEN: Antibody Screen: NEGATIVE

## 2018-10-01 LAB — HEPATITIS B SURFACE ANTIGEN: Hepatitis B Surface Ag: NEGATIVE

## 2018-10-01 LAB — RPR: RPR Ser Ql: NONREACTIVE

## 2018-10-02 LAB — URINE CULTURE

## 2018-10-06 ENCOUNTER — Other Ambulatory Visit (INDEPENDENT_AMBULATORY_CARE_PROVIDER_SITE_OTHER): Payer: Self-pay

## 2018-10-06 ENCOUNTER — Other Ambulatory Visit: Payer: Self-pay

## 2018-10-06 ENCOUNTER — Other Ambulatory Visit: Payer: Self-pay | Admitting: Obstetrics and Gynecology

## 2018-10-06 DIAGNOSIS — Z3687 Encounter for antenatal screening for uncertain dates: Secondary | ICD-10-CM

## 2018-10-06 DIAGNOSIS — N926 Irregular menstruation, unspecified: Secondary | ICD-10-CM

## 2018-10-06 LAB — GC/CHLAMYDIA PROBE AMP
Chlamydia trachomatis, NAA: NEGATIVE
Neisseria Gonorrhoeae by PCR: NEGATIVE

## 2018-10-17 ENCOUNTER — Encounter: Payer: Self-pay | Admitting: Certified Nurse Midwife

## 2018-10-21 ENCOUNTER — Encounter: Payer: Self-pay | Admitting: Certified Nurse Midwife

## 2018-10-21 ENCOUNTER — Ambulatory Visit (INDEPENDENT_AMBULATORY_CARE_PROVIDER_SITE_OTHER): Payer: Self-pay | Admitting: Certified Nurse Midwife

## 2018-10-21 ENCOUNTER — Other Ambulatory Visit: Payer: Self-pay

## 2018-10-21 ENCOUNTER — Other Ambulatory Visit (HOSPITAL_COMMUNITY)
Admission: RE | Admit: 2018-10-21 | Discharge: 2018-10-21 | Disposition: A | Discharge status: Home / Self Care | Payer: Medicaid Other | Source: Ambulatory Visit | Attending: Certified Nurse Midwife | Admitting: Certified Nurse Midwife

## 2018-10-21 VITALS — BP 93/70 | HR 95 | Wt 163.2 lb

## 2018-10-21 DIAGNOSIS — Z3482 Encounter for supervision of other normal pregnancy, second trimester: Secondary | ICD-10-CM | POA: Insufficient documentation

## 2018-10-21 DIAGNOSIS — Z3A12 12 weeks gestation of pregnancy: Secondary | ICD-10-CM

## 2018-10-21 LAB — POCT URINALYSIS DIPSTICK OB
Bilirubin, UA: NEGATIVE
Blood, UA: NEGATIVE
Glucose, UA: NEGATIVE
Ketones, UA: NEGATIVE
Leukocytes, UA: NEGATIVE
Nitrite, UA: NEGATIVE
POC,PROTEIN,UA: NEGATIVE
Spec Grav, UA: 1.02 (ref 1.010–1.025)
Urobilinogen, UA: 0.2 E.U./dL
pH, UA: 5 (ref 5.0–8.0)
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
## 2018-10-21 NOTE — Patient Instructions (Signed)

## 2018-10-25 LAB — CYTOLOGY - PAP: Diagnosis: NEGATIVE

## 2018-11-17 ENCOUNTER — Encounter: Payer: Self-pay | Admitting: Obstetrics and Gynecology

## 2018-11-18 ENCOUNTER — Ambulatory Visit (INDEPENDENT_AMBULATORY_CARE_PROVIDER_SITE_OTHER): Payer: Medicaid Other | Admitting: Certified Nurse Midwife

## 2018-11-18 ENCOUNTER — Other Ambulatory Visit: Payer: Self-pay

## 2018-11-18 VITALS — BP 112/70 | HR 81 | Wt 161.5 lb

## 2018-11-18 DIAGNOSIS — Z3689 Encounter for other specified antenatal screening: Secondary | ICD-10-CM

## 2018-11-18 DIAGNOSIS — Z3A16 16 weeks gestation of pregnancy: Secondary | ICD-10-CM | POA: Diagnosis not present

## 2018-11-18 DIAGNOSIS — Z3492 Encounter for supervision of normal pregnancy, unspecified, second trimester: Secondary | ICD-10-CM | POA: Diagnosis not present

## 2018-11-18 DIAGNOSIS — Z8669 Personal history of other diseases of the nervous system and sense organs: Secondary | ICD-10-CM

## 2018-11-18 DIAGNOSIS — U070 Vaping-related disorder: Secondary | ICD-10-CM | POA: Insufficient documentation

## 2018-11-18 LAB — POCT URINALYSIS DIPSTICK OB
Bilirubin, UA: NEGATIVE
Blood, UA: NEGATIVE
Glucose, UA: NEGATIVE
Ketones, UA: NEGATIVE
Leukocytes, UA: NEGATIVE
Nitrite, UA: NEGATIVE
POC,PROTEIN,UA: NEGATIVE
Spec Grav, UA: 1.015 (ref 1.010–1.025)
Urobilinogen, UA: 0.2 E.U./dL
pH, UA: 7 (ref 5.0–8.0)

## 2018-11-18 NOTE — Patient Instructions (Signed)
Eating Plan for Pregnant Women While you are pregnant, your body requires additional nutrition to help support your growing baby. You also have a higher need for some vitamins and minerals, such as folic acid, calcium, iron, and vitamin D. Eating a healthy, well-balanced diet is very important for your health and your baby's health. Your need for extra calories varies for the three 76-monthsegments of your pregnancy (trimesters). For most women, it is recommended to consume:  150 extra calories a day during the first trimester.  300 extra calories a day during the second trimester.  300 extra calories a day during the third trimester. What are tips for following this plan?   Do not try to lose weight or go on a diet during pregnancy.  Limit your overall intake of foods that have "empty calories." These are foods that have little nutritional value, such as sweets, desserts, candies, and sugar-sweetened beverages.  Eat a variety of foods (especially fruits and vegetables) to get a full range of vitamins and minerals.  Take a prenatal vitamin to help meet your additional vitamin and mineral needs during pregnancy, specifically for folic acid, iron, calcium, and vitamin D.  Remember to stay active. Ask your health care provider what types of exercise and activities are safe for you.  Practice good food safety and cleanliness. Wash your hands before you eat and after you prepare raw meat. Wash all fruits and vegetables well before peeling or eating. Taking these actions can help to prevent food-borne illnesses that can be very dangerous to your baby, such as listeriosis. Ask your health care provider for more information about listeriosis. What does 150 extra calories look like? Healthy options that provide 150 extra calories each day could be any of the following:  6-8 oz (170-230 g) of plain low-fat yogurt with  cup of berries.  1 apple with 2 teaspoons (11 g) of peanut butter.  Cut-up  vegetables with  cup (60 g) of hummus.  8 oz (230 mL) or 1 cup of low-fat chocolate milk.  1 stick of string cheese with 1 medium orange.  1 peanut butter and jelly sandwich that is made with one slice of whole-wheat bread and 1 tsp (5 g) of peanut butter. For 300 extra calories, you could eat two of those healthy options each day. What is a healthy amount of weight to gain? The right amount of weight gain for you is based on your BMI before you became pregnant. If your BMI:  Was less than 18 (underweight), you should gain 28-40 lb (13-18 kg).  Was 18-24.9 (normal), you should gain 25-35 lb (11-16 kg).  Was 25-29.9 (overweight), you should gain 15-25 lb (7-11 kg).  Was 30 or greater (obese), you should gain 11-20 lb (5-9 kg). What if I am having twins or multiples? Generally, if you are carrying twins or multiples:  You may need to eat 300-600 extra calories a day.  The recommended range for total weight gain is 25-54 lb (11-25 kg), depending on your BMI before pregnancy.  Talk with your health care provider to find out about nutritional needs, weight gain, and exercise that is right for you. What foods can I eat?  Grains All grains. Choose whole grains, such as whole-wheat bread, oatmeal, or brown rice. Vegetables All vegetables. Eat a variety of colors and types of vegetables. Remember to wash your vegetables well before peeling or eating. Fruits All fruits. Eat a variety of colors and types of fruit. Remember to wash  your fruits well before peeling or eating. Meats and other protein foods Lean meats, including chicken, [place], fish, and lean cuts of beef, veal, or pork. If you eat fish or seafood, choose options that are higher in omega-3 fatty acids and lower in mercury, such as salmon, herring, mussels, trout, sardines, pollock, shrimp, crab, and lobster. Tofu. Tempeh. Beans. Eggs. Peanut butter and other nut butters. Make sure that all meats, poultry, and eggs are cooked to  food-safe temperatures or "well-done." Two or more servings of fish are recommended each week in order to get the most benefits from omega-3 fatty acids that are found in seafood. Choose fish that are lower in mercury. You can find more information online:  [employer]ch Dairy Pasteurized milk and milk alternatives (such as almond milk). Pasteurized yogurt and pasteurized cheese. Cottage cheese. Sour cream. Beverages Water. Juices that contain 100% fruit juice or vegetable juice. Caffeine-free teas and decaffeinated coffee. Drinks that contain caffeine are okay to drink, but it is better to avoid caffeine. Keep your total caffeine intake to less than 200 mg each day (which is 12 oz or 355 mL of coffee, tea, or soda) or the limit as told by your health care provider. Fats and oils Fats and oils are okay to include in moderation. Sweets and desserts Sweets and desserts are okay to include in moderation. Seasoning and other foods All pasteurized condiments. The items listed above may not be a complete list of recommended foods and beverages. Contact your dietitian for more options. The items listed above may not be a complete list of foods and beverages [you/your child] can eat. Contact a dietitian for more information. What foods are not recommended? Vegetables Raw (unpasteurized) vegetable juices. Fruits Unpasteurized fruit juices. Meats and other protein foods Lunch meats, bologna, hot dogs, or other deli meats. (If you must eat those meats, reheat them until they are steaming hot.) Refrigerated pat, meat spreads from a meat counter, smoked seafood that is found in the refrigerated section of a store. Raw or undercooked meats, poultry, and eggs. Raw fish, such as sushi or sashimi. Fish that have high mercury content, such as tilefish, shark, swordfish, and king mackerel. To learn more about mercury in fish, talk with your health care provider or look for online resources, such as:   [employer] Dairy Raw (unpasteurized) milk and any foods that have raw milk in them. Soft cheeses, such as feta, queso blanco, queso fresco, Brie, Camembert cheeses, blue-veined cheeses, and Panela cheese (unless it is made with pasteurized milk, which must be stated on the label). Beverages Alcohol. Sugar-sweetened beverages, such as sodas, teas, or energy drinks. Seasoning and other foods Homemade fermented foods and drinks, such as pickles, sauerkraut, or kombucha drinks. (Store-bought pasteurized versions of these are okay.) Salads that are made in a store or deli, such as ham salad, chicken salad, egg salad, tuna salad, and seafood salad. The items listed above may not be a complete list of foods and beverages to avoid. Contact your dietitian for more information. The items listed above may not be a complete list of foods and beverages [you/your child] should avoid. Contact a dietitian for more information. Where to find more information To calculate the number of calories you need based on your height, weight, and activity level, you can use an online calculator such as:  MobileTransition.ch To calculate how much weight you should gain during pregnancy, you can use an online pregnancy weight gain calculator such as:  StreamingFood.com.cy Summary  While you  are pregnant, your body requires additional nutrition to help support your growing baby.  Eat a variety of foods, especially fruits and vegetables to get a full range of vitamins and minerals.  Practice good food safety and cleanliness. Wash your hands before you eat and after you prepare raw meat. Wash all fruits and vegetables well before peeling or eating. Taking these actions can help to prevent food-borne illnesses, such as listeriosis, that can be very dangerous to your baby.  Do not eat raw meat or fish. Do not eat fish that have high mercury content, such as tilefish, shark,  swordfish, and king mackerel. Do not eat unpasteurized (raw) dairy.  Take a prenatal vitamin to help meet your additional vitamin and mineral needs during pregnancy, specifically for folic acid, iron, calcium, and vitamin D. This information is not intended to replace advice given to you by your health care provider. Make sure you discuss any questions you have with your health care provider. Document Released: 12/08/2013 Document Revised: 06/16/2018 Document Reviewed: 11/20/2016 Elsevier Patient Education  2020 [employer] Inc. WHAT OB PATIENTS CAN EXPECT   Confirmation of pregnancy and ultrasound ordered if medically indicated-[redacted] weeks gestation  New OB (NOB) intake with nurse and New OB (NOB) labs- [redacted] weeks gestation  New OB (NOB) physical examination with provider- 11/[redacted] weeks gestation  Flu vaccine-[redacted] weeks gestation  Anatomy scan-[redacted] weeks gestation  Glucose tolerance test, blood work to test for anemia, T-dap vaccine-[redacted] weeks gestation  Vaginal swabs/cultures-STD/Group B strep-[redacted] weeks gestation  Appointments every 4 weeks until 28 weeks  Every 2 weeks from 28 weeks until 36 weeks  Weekly visits from 36 weeks until delivery  Back Pain in Pregnancy Back pain during pregnancy is common. Back pain may be caused by several factors that are related to changes during your pregnancy. Follow these instructions at home: Managing pain, stiffness, and swelling      If directed, for sudden (acute) back pain, put ice on the painful area. ? Put ice in a plastic bag. ? Place a towel between your skin and the bag. ? Leave the ice on for 20 minutes, 2-3 times per day.  If directed, apply heat to the affected area before you exercise. Use the heat source that your health care provider recommends, such as a moist heat pack or a heating pad. ? Place a towel between your skin and the heat source. ? Leave the heat on for 20-30 minutes. ? Remove the heat if your skin turns bright red. This is  especially important if you are unable to feel pain, heat, or cold. You may have a greater risk of getting burned.  If directed, massage the affected area. Activity  Exercise as told by your health care provider. Gentle exercise is the best way to prevent or manage back pain.  Listen to your body when lifting. If lifting hurts, ask for help or bend your knees. This uses your leg muscles instead of your back muscles.  Squat down when picking up something from the floor. Do not bend over.  Only use bed rest for short periods as told by your health care provider. Bed rest should only be used for the most severe episodes of back pain. Standing, sitting, and lying down  Do not stand in one place for long periods of time.  Use good posture when sitting. Make sure your head rests over your shoulders and is not hanging forward. Use a pillow on your lower back if necessary.  Try sleeping  on your side, preferably the left side, with a pregnancy support pillow or 1-2 regular pillows between your legs. ? If you have back pain after a night's rest, your bed may be too soft. ? A firm mattress may provide more support for your back during pregnancy. General instructions  Do not wear high heels.  Eat a healthy diet. Try to gain weight within your health care provider's recommendations.  Use a maternity girdle, elastic sling, or back brace as told by your health care provider.  Take over-the-counter and prescription medicines only as told by your health care provider.  Work with a physical therapist or massage therapist to find ways to manage back pain. Acupuncture or massage therapy may be helpful.  Keep all follow-up visits as told by your health care provider. This is important. Contact a health care provider if:  Your back pain interferes with your daily activities.  You have increasing pain in other parts of your body. Get help right away if:  You develop numbness, tingling, weakness, or  problems with the use of your arms or legs.  You develop severe back pain that is not controlled with medicine.  You have a change in bowel or bladder control.  You develop shortness of breath, dizziness, or you faint.  You develop nausea, vomiting, or sweating.  You have back pain that is a rhythmic, cramping pain similar to labor pains. Labor pain is usually 1-2 minutes apart, lasts for about 1 minute, and involves a bearing down feeling or pressure in your pelvis.  You have back pain and your water breaks or you have vaginal bleeding.  You have back pain or numbness that travels down your leg.  Your back pain developed after you fell.  You develop pain on one side of your back.  You see blood in your urine.  You develop skin blisters in the area of your back pain. Summary  Back pain may be caused by several factors that are related to changes during your pregnancy.  Follow instructions as told by your health care provider for managing pain, stiffness, and swelling.  Exercise as told by your health care provider. Gentle exercise is the best way to prevent or manage back pain.  Take over-the-counter and prescription medicines only as told by your health care provider.  Keep all follow-up visits as told by your health care provider. This is important. This information is not intended to replace advice given to you by your health care provider. Make sure you discuss any questions you have with your health care provider. Document Released: 06/03/2005 Document Revised: 06/14/2018 Document Reviewed: 08/11/2017 Elsevier Patient Education  2020 Elsevier Inc. Round Ligament Pain  The round ligament is a cord of muscle and tissue that helps support the uterus. It can become a source of pain during pregnancy if it becomes stretched or twisted as the baby grows. The pain usually begins in the second trimester (13-28 weeks) of pregnancy, and it can come and go until the baby is  delivered. It is not a serious problem, and it does not cause harm to the baby. Round ligament pain is usually a short, sharp, and pinching pain, but it can also be a dull, lingering, and aching pain. The pain is felt in the lower side of the abdomen or in the groin. It usually starts deep in the groin and moves up to the outside of the hip area. The pain may occur when you:  Suddenly change position, such as  quickly going from a sitting to standing position.  Roll over in bed.  Cough or sneeze.  Do physical activity. Follow these instructions at home:   Watch your condition for any changes.  When the pain starts, relax. Then try any of these methods to help with the pain: ? Sitting down. ? Flexing your knees up to your abdomen. ? Lying on your side with one pillow under your abdomen and another pillow between your legs. ? Sitting in a warm bath for 15-20 minutes or until the pain goes away.  Take over-the-counter and prescription medicines only as told by your health care provider.  Move slowly when you sit down or stand up.  Avoid long walks if they cause pain.  Stop or reduce your physical activities if they cause pain.  Keep all follow-up visits as told by your health care provider. This is important. Contact a health care provider if:  Your pain does not go away with treatment.  You feel pain in your back that you did not have before.  Your medicine is not helping. Get help right away if:  You have a fever or chills.  You develop uterine contractions.  You have vaginal bleeding.  You have nausea or vomiting.  You have diarrhea.  You have pain when you urinate. Summary  Round ligament pain is felt in the lower abdomen or groin. It is usually a short, sharp, and pinching pain. It can also be a dull, lingering, and aching pain.  This pain usually begins in the second trimester (13-28 weeks). It occurs because the uterus is stretching with the growing baby, and  it is not harmful to the baby.  You may notice the pain when you suddenly change position, when you cough or sneeze, or during physical activity.  Relaxing, flexing your knees to your abdomen, lying on one side, or taking a warm bath may help to get rid of the pain.  Get help from your health care provider if the pain does not go away or if you have vaginal bleeding, nausea, vomiting, diarrhea, or painful urination. This information is not intended to replace advice given to you by your health care provider. Make sure you discuss any questions you have with your health care provider. Document Released: 12/03/2007 Document Revised: 08/11/2017 Document Reviewed: 08/11/2017 Elsevier Patient Education  2020 [employer].

## 2018-11-18 NOTE — Progress Notes (Signed)
ROB- pt is c/o fatigue, is taking her iron

## 2018-11-18 NOTE — Progress Notes (Signed)
ROB-Reports fatigue, taking iron daily. Advised will repeat labs at next visit if symptoms worsen or fail to improve further into the second trimester. Vaping occasionally. Declines genetic testing at this time-"doesn't want blood draw today". PMH completed. Anticipatory guidance regarding course of prenatal care. Reviewed red flag symptoms and when to call. RTC x 4 weeks for ANATOMY SCAN and ROB or sooner if needed.

## 2018-11-22 ENCOUNTER — Encounter: Payer: Self-pay | Admitting: Neurology

## 2018-11-24 NOTE — Addendum Note (Signed)
Addended by: Leanora Murin on: 11/24/2018 08:59 AM   Modules accepted: Orders  

## 2018-12-19 ENCOUNTER — Encounter: Payer: Medicaid Other | Admitting: Certified Nurse Midwife

## 2018-12-19 ENCOUNTER — Other Ambulatory Visit: Payer: Medicaid Other

## 2018-12-21 ENCOUNTER — Ambulatory Visit (INDEPENDENT_AMBULATORY_CARE_PROVIDER_SITE_OTHER): Payer: Medicaid Other

## 2018-12-21 ENCOUNTER — Ambulatory Visit (INDEPENDENT_AMBULATORY_CARE_PROVIDER_SITE_OTHER): Payer: Medicaid Other | Admitting: Certified Nurse Midwife

## 2018-12-21 ENCOUNTER — Other Ambulatory Visit: Payer: Self-pay

## 2018-12-21 ENCOUNTER — Encounter: Payer: Self-pay | Admitting: Certified Nurse Midwife

## 2018-12-21 VITALS — BP 108/71 | HR 85 | Wt 166.2 lb

## 2018-12-21 DIAGNOSIS — Z3689 Encounter for other specified antenatal screening: Secondary | ICD-10-CM

## 2018-12-21 DIAGNOSIS — Z3492 Encounter for supervision of normal pregnancy, unspecified, second trimester: Secondary | ICD-10-CM | POA: Diagnosis not present

## 2018-12-21 LAB — POCT URINALYSIS DIPSTICK OB
Bilirubin, UA: NEGATIVE
Blood, UA: NEGATIVE
Glucose, UA: NEGATIVE
Ketones, UA: NEGATIVE
Leukocytes, UA: NEGATIVE
Nitrite, UA: NEGATIVE
POC,PROTEIN,UA: NEGATIVE
Spec Grav, UA: 1.015 (ref 1.010–1.025)
Urobilinogen, UA: 0.2 E.U./dL
pH, UA: 6 (ref 5.0–8.0)

## 2018-12-21 NOTE — Patient Instructions (Signed)

## 2018-12-21 NOTE — Progress Notes (Signed)
ROB dong well. Feels movement. Anatomy u/s today ( see below) results reviewed. Anterior placenta discussed. Round ligament pain and musculoskeletal discomforts of pregnancy discussed. She verbalize understanding. Follow up 4 wks.   Philip Aspen, CNM   Patient Name: Tamara Barron DOB: Dec 01, 1995 MRN: [id number] ULTRASOUND REPORT  Location: [facility] Date of Service: 12/21/2018   Indications:Anatomy Ultrasound Findings:  Tamara Barron intrauterine pregnancy is visualized with FHR at 163 BPM. Biometrics give an (U/S) Gestational age of [redacted]w[redacted]d and an (U/S) EDD of 05/02/2019; this does  correlates with the clinically established Estimated Date of Delivery: 04/29/19  Fetal presentation is Cephalic.  EFW: 425 g ( 15 oz ). Placenta: anterior. Grade: 1 AFI: subjectively normal.  Anatomic survey is complete and normal; Gender - female.    Right Ovary is normal in appearance. Left Ovary is normal appearance. Survey of the adnexa demonstrates no adnexal masses. There is no free peritoneal fluid in the cul de sac.  Impression: 1. [redacted]w[redacted]d Viable Singleton Intrauterine pregnancy by U/S. 2. (U/S) EDD is consistent with Clinically established Estimated Date of Delivery: 04/29/19 . 3. Normal Anatomy Scan  Recommendations: 1.Clinical correlation with the patient's History and Physical Exam.   Jenine M. Albertine Grates    RDMS

## 2018-12-27 ENCOUNTER — Telehealth: Payer: Self-pay

## 2018-12-27 ENCOUNTER — Telehealth: Payer: Self-pay | Admitting: Certified Nurse Midwife

## 2018-12-27 NOTE — Telephone Encounter (Signed)
The patient called and stated that she missed a call from the office. I was unable to see any documentation in pts chart to determine who called pt. Please advise.

## 2018-12-27 NOTE — Telephone Encounter (Signed)
mychart message sent to patient- noone called her- I sent a mychart message to her earlier.

## 2019-01-04 ENCOUNTER — Telehealth: Payer: Self-pay

## 2019-01-04 NOTE — Telephone Encounter (Signed)
error 

## 2019-01-04 NOTE — Telephone Encounter (Signed)
Mychart message sent to patient re: letter she requested is done.

## 2019-01-18 ENCOUNTER — Ambulatory Visit (INDEPENDENT_AMBULATORY_CARE_PROVIDER_SITE_OTHER): Payer: Medicaid Other | Admitting: Certified Nurse Midwife

## 2019-01-18 ENCOUNTER — Encounter: Payer: Medicaid Other | Admitting: Obstetrics and Gynecology

## 2019-01-18 ENCOUNTER — Other Ambulatory Visit: Payer: Self-pay

## 2019-01-18 VITALS — BP 110/65 | HR 106 | Wt 168.1 lb

## 2019-01-18 DIAGNOSIS — Z87898 Personal history of other specified conditions: Secondary | ICD-10-CM

## 2019-01-18 DIAGNOSIS — Z3492 Encounter for supervision of normal pregnancy, unspecified, second trimester: Secondary | ICD-10-CM

## 2019-01-18 NOTE — Progress Notes (Signed)
ROB doing well. Feels good movement. Denies contractions but is feeling some pelvic discomfort. Reassurance given. Discussed 28 wk visit. Pt denies that she is using alcohol. States she did some in the beginning before she knew she was pregnant but has not had anything to drink in a long time. She admits to vaping on occasion. Information booklet given to pt on vaping in pregnancy , encouraged her to stop. Follow up 2 wks   Philip Aspen, CNM

## 2019-01-18 NOTE — Patient Instructions (Signed)
Glucose Tolerance Test During Pregnancy Why am I having this test? The glucose tolerance test (GTT) is done to check how your body processes sugar (glucose). This is one of several tests used to diagnose diabetes that develops during pregnancy (gestational diabetes mellitus). Gestational diabetes is a temporary form of diabetes that some women develop during pregnancy. It usually occurs during the second trimester of pregnancy and goes away after delivery. Testing (screening) for gestational diabetes usually occurs between 24 and 28 weeks of pregnancy. You may have the GTT test after having a 1-hour glucose screening test if the results from that test indicate that you may have gestational diabetes. You may also have this test if:  You have a history of gestational diabetes.  You have a history of giving birth to very large babies or have experienced repeated fetal loss (stillbirth).  You have signs and symptoms of diabetes, such as: ? Changes in your vision. ? Tingling or numbness in your hands or feet. ? Changes in hunger, thirst, and urination that are not otherwise explained by your pregnancy. What is being tested? This test measures the amount of glucose in your blood at different times during a period of 3 hours. This indicates how well your body is able to process glucose. What kind of sample is taken?  Blood samples are required for this test. They are usually collected by inserting a needle into a blood vessel. How do I prepare for this test?  For 3 days before your test, eat normally. Have plenty of carbohydrate-rich foods.  Follow instructions from your health care provider about: ? Eating or drinking restrictions on the day of the test. You may be asked to not eat or drink anything other than water (fast) starting 8-10 hours before the test. ? Changing or stopping your regular medicines. Some medicines may interfere with this test. Tell a health care provider about:  All  medicines you are taking, including vitamins, herbs, eye drops, creams, and over-the-counter medicines.  Any blood disorders you have.  Any surgeries you have had.  Any medical conditions you have. What happens during the test? First, your blood glucose will be measured. This is referred to as your fasting blood glucose, since you fasted before the test. Then, you will drink a glucose solution that contains a certain amount of glucose. Your blood glucose will be measured again 1, 2, and 3 hours after drinking the solution. This test takes about 3 hours to complete. You will need to stay at the testing location during this time. During the testing period:  Do not eat or drink anything other than the glucose solution.  Do not exercise.  Do not use any products that contain nicotine or tobacco, such as cigarettes and e-cigarettes. If you need help stopping, ask your health care provider. The testing procedure may vary among health care providers and hospitals. How are the results reported? Your results will be reported as milligrams of glucose per deciliter of blood (mg/dL) or millimoles per liter (mmol/L). Your health care provider will compare your results to normal ranges that were established after testing a large group of people (reference ranges). Reference ranges may vary among labs and hospitals. For this test, common reference ranges are:  Fasting: less than 95-105 mg/dL (5.3-5.8 mmol/L).  1 hour after drinking glucose: less than 180-190 mg/dL (10.0-10.5 mmol/L).  2 hours after drinking glucose: less than 155-165 mg/dL (8.6-9.2 mmol/L).  3 hours after drinking glucose: 140-145 mg/dL (7.8-8.1 mmol/L). What do the  results mean? Results within reference ranges are considered normal, meaning that your glucose levels are well-controlled. If two or more of your blood glucose levels are high, you may be diagnosed with gestational diabetes. If only one level is high, your health care  provider may suggest repeat testing or other tests to confirm a diagnosis. Talk with your health care provider about what your results mean. Questions to ask your health care provider Ask your health care provider, or the department that is doing the test:  When will my results be ready?  How will I get my results?  What are my treatment options?  What other tests do I need?  What are my next steps? Summary  The glucose tolerance test (GTT) is one of several tests used to diagnose diabetes that develops during pregnancy (gestational diabetes mellitus). Gestational diabetes is a temporary form of diabetes that some women develop during pregnancy.  You may have the GTT test after having a 1-hour glucose screening test if the results from that test indicate that you may have gestational diabetes. You may also have this test if you have any symptoms or risk factors for gestational diabetes.  Talk with your health care provider about what your results mean. This information is not intended to replace advice given to you by your health care provider. Make sure you discuss any questions you have with your health care provider. Document Released: 08/25/2011 Document Revised: 06/16/2018 Document Reviewed: 10/05/2016 Elsevier Patient Education  2020 Elsevier Inc. Td Vaccine (Tetanus and Diphtheria): What You Need to Know 1. Why get vaccinated? Tetanus  and diphtheria are very serious diseases. They are rare in the [place] today, but people who do become infected often have severe complications. Td vaccine is used to protect adolescents and adults from both of these diseases. Both tetanus and diphtheria are infections caused by bacteria. Diphtheria spreads from person to person through coughing or sneezing. Tetanus-causing bacteria enter the body through cuts, scratches, or wounds. TETANUS (Lockjaw) causes painful muscle tightening and stiffness, usually all over the body.  It can lead to  tightening of muscles in the head and neck so you can't open your mouth, swallow, or sometimes even breathe. Tetanus kills about 1 out of every 10 people who are infected even after receiving the best medical care. DIPHTHERIA can cause a thick coating to form in the back of the throat.  It can lead to breathing problems, paralysis, heart failure, and death. Before vaccines, as many as 200,000 cases of diphtheria and hundreds of cases of tetanus were reported in the [place] each year. Since vaccination began, reports of cases for both diseases have dropped by about 99%. 2. Td vaccine Td vaccine can protect adolescents and adults from tetanus and diphtheria. Td is usually given as a booster dose every 10 years but it can also be given earlier after a severe and dirty wound or burn. Another vaccine, called Tdap, which protects against pertussis in addition to tetanus and diphtheria, is sometimes recommended instead of Td vaccine. Your doctor or the person giving you the vaccine can give you more information. Td may safely be given at the same time as other vaccines. 3. Some people should not get this vaccine  A person who has ever had a life-threatening allergic reaction after a previous dose of any tetanus or diphtheria containing vaccine, OR has a severe allergy to any part of this vaccine, should not get Td vaccine. Tell the person giving the vaccine  about any severe allergies.  Talk to your doctor if you: ? had severe pain or swelling after any vaccine containing diphtheria or tetanus, ? ever had a condition called Guillain Barr Syndrome (GBS), ? aren't feeling well on the day the shot is scheduled. 4. Risks of a vaccine reaction With any medicine, including vaccines, there is a chance of side effects. These are usually mild and go away on their own. Serious reactions are also possible but are rare. Most people who get Td vaccine do not have any problems with it. Mild Problems following  Td vaccine: (Did not interfere with activities)  Pain where the shot was given (about 8 people in 10)  Redness or swelling where the shot was given (about 1 person in 4)  Mild fever (rare)  Headache (about 1 person in 4)  Tiredness (about 1 person in 4) Moderate Problems following Td vaccine: (Interfered with activities, but did not require medical attention)  Fever over 102F (rare) Severe Problems following Td vaccine: (Unable to perform usual activities; required medical attention)  Swelling, severe pain, bleeding and/or redness in the arm where the shot was given (rare). Problems that could happen after any vaccine:  People sometimes faint after a medical procedure, including vaccination. Sitting or lying down for about 15 minutes can help prevent fainting, and injuries caused by a fall. Tell your doctor if you feel dizzy, or have vision changes or ringing in the ears.  Some people get severe pain in the shoulder and have difficulty moving the arm where a shot was given. This happens very rarely.  Any medication can cause a severe allergic reaction. Such reactions from a vaccine are very rare, estimated at fewer than 1 in a million doses, and would happen within a few minutes to a few hours after the vaccination. As with any medicine, there is a very remote chance of a vaccine causing a serious injury or death. The safety of vaccines is always being monitored. For more information, visit: http://floyd.org/www.cdc.gov/vaccinesafety/ 5. What if there is a serious reaction? What should I look for?  Look for anything that concerns you, such as signs of a severe allergic reaction, very high fever, or unusual behavior. Signs of a severe allergic reaction can include hives, swelling of the face and throat, difficulty breathing, a fast heartbeat, dizziness, and weakness. These would usually start a few minutes to a few hours after the vaccination. What should I do?  If you think it is a severe  allergic reaction or other emergency that can't wait, call 9-1-1 or get the person to the nearest hospital. Otherwise, call your doctor.  Afterward, the reaction should be reported to the Vaccine Adverse Event Reporting System (VAERS). Your doctor might file this report, or you can do it yourself through the VAERS web site at www.vaers.LAgents.nohhs.gov, or by calling 1-(380)883-8525. VAERS does not give medical advice. 6. The National Vaccine Injury Compensation Program The [employer] Vaccine Injury Compensation Program (VICP) is a federal program that was created to compensate people who may have been injured by certain vaccines. Persons who believe they may have been injured by a vaccine can learn about the program and about filing a claim by calling 1-760 607 4413 or visiting the VICP website at SpiritualWord.atwww.hrsa.gov/vaccinecompensation. There is a time limit to file a claim for compensation. 7. How can I learn more?  Ask your doctor. He or she can give you the vaccine package insert or suggest other sources of information.  Call your local or state  health department.  Contact the Centers for Disease Control and Prevention (CDC): ? Call 731-066-3095 (1-800-CDC-INFO) ? Visit CDC's website at http://hunter.com/ Vaccine Information Statement Td Vaccine (06/18/15) This information is not intended to replace advice given to you by your health care provider. Make sure you discuss any questions you have with your health care provider. Document Released: 12/21/2005 Document Revised: 10/11/2017 Document Reviewed: 10/11/2017 Elsevier Interactive Patient Education  [employer].

## 2019-01-30 ENCOUNTER — Ambulatory Visit: Payer: Self-pay | Admitting: Neurology

## 2019-02-01 ENCOUNTER — Other Ambulatory Visit: Payer: Medicaid Other

## 2019-02-01 ENCOUNTER — Ambulatory Visit (INDEPENDENT_AMBULATORY_CARE_PROVIDER_SITE_OTHER): Payer: Medicaid Other | Admitting: Certified Nurse Midwife

## 2019-02-01 ENCOUNTER — Other Ambulatory Visit: Payer: Self-pay

## 2019-02-01 VITALS — BP 114/64 | HR 91 | Wt 168.2 lb

## 2019-02-01 DIAGNOSIS — Z3492 Encounter for supervision of normal pregnancy, unspecified, second trimester: Secondary | ICD-10-CM

## 2019-02-01 DIAGNOSIS — Z87898 Personal history of other specified conditions: Secondary | ICD-10-CM

## 2019-02-01 MED ORDER — TETANUS-DIPHTH-ACELL PERTUSSIS 5-2.5-18.5 LF-MCG/0.5 IM SUSP
0.5000 mL | Freq: Once | INTRAMUSCULAR | Status: AC
  Administered 2019-02-01: 11:00:00 0.5 mL via INTRAMUSCULAR

## 2019-02-01 NOTE — Progress Notes (Signed)
ROB doing well. Feels good movement. 28 wk labs today BTC/RPR/CBC/glucose screen/ETOC screen. No complaints. Follow up 2 wks.   Philip Aspen, CNM

## 2019-02-01 NOTE — Patient Instructions (Signed)
Glucose Tolerance Test During Pregnancy Why am I having this test? The glucose tolerance test (GTT) is done to check how your body processes sugar (glucose). This is one of several tests used to diagnose diabetes that develops during pregnancy (gestational diabetes mellitus). Gestational diabetes is a temporary form of diabetes that some women develop during pregnancy. It usually occurs during the second trimester of pregnancy and goes away after delivery. Testing (screening) for gestational diabetes usually occurs between 24 and 28 weeks of pregnancy. You may have the GTT test after having a 1-hour glucose screening test if the results from that test indicate that you may have gestational diabetes. You may also have this test if:  You have a history of gestational diabetes.  You have a history of giving birth to very large babies or have experienced repeated fetal loss (stillbirth).  You have signs and symptoms of diabetes, such as: ? Changes in your vision. ? Tingling or numbness in your hands or feet. ? Changes in hunger, thirst, and urination that are not otherwise explained by your pregnancy. What is being tested? This test measures the amount of glucose in your blood at different times during a period of 3 hours. This indicates how well your body is able to process glucose. What kind of sample is taken?  Blood samples are required for this test. They are usually collected by inserting a needle into a blood vessel. How do I prepare for this test?  For 3 days before your test, eat normally. Have plenty of carbohydrate-rich foods.  Follow instructions from your health care provider about: ? Eating or drinking restrictions on the day of the test. You may be asked to not eat or drink anything other than water (fast) starting 8-10 hours before the test. ? Changing or stopping your regular medicines. Some medicines may interfere with this test. Tell a health care provider about:  All  medicines you are taking, including vitamins, herbs, eye drops, creams, and over-the-counter medicines.  Any blood disorders you have.  Any surgeries you have had.  Any medical conditions you have. What happens during the test? First, your blood glucose will be measured. This is referred to as your fasting blood glucose, since you fasted before the test. Then, you will drink a glucose solution that contains a certain amount of glucose. Your blood glucose will be measured again 1, 2, and 3 hours after drinking the solution. This test takes about 3 hours to complete. You will need to stay at the testing location during this time. During the testing period:  Do not eat or drink anything other than the glucose solution.  Do not exercise.  Do not use any products that contain nicotine or tobacco, such as cigarettes and e-cigarettes. If you need help stopping, ask your health care provider. The testing procedure may vary among health care providers and hospitals. How are the results reported? Your results will be reported as milligrams of glucose per deciliter of blood (mg/dL) or millimoles per liter (mmol/L). Your health care provider will compare your results to normal ranges that were established after testing a large group of people (reference ranges). Reference ranges may vary among labs and hospitals. For this test, common reference ranges are:  Fasting: less than 95-105 mg/dL (5.3-5.8 mmol/L).  1 hour after drinking glucose: less than 180-190 mg/dL (10.0-10.5 mmol/L).  2 hours after drinking glucose: less than 155-165 mg/dL (8.6-9.2 mmol/L).  3 hours after drinking glucose: 140-145 mg/dL (7.8-8.1 mmol/L). What do the   results mean? Results within reference ranges are considered normal, meaning that your glucose levels are well-controlled. If two or more of your blood glucose levels are high, you may be diagnosed with gestational diabetes. If only one level is high, your health care  provider may suggest repeat testing or other tests to confirm a diagnosis. Talk with your health care provider about what your results mean. Questions to ask your health care provider Ask your health care provider, or the department that is doing the test:  When will my results be ready?  How will I get my results?  What are my treatment options?  What other tests do I need?  What are my next steps? Summary  The glucose tolerance test (GTT) is one of several tests used to diagnose diabetes that develops during pregnancy (gestational diabetes mellitus). Gestational diabetes is a temporary form of diabetes that some women develop during pregnancy.  You may have the GTT test after having a 1-hour glucose screening test if the results from that test indicate that you may have gestational diabetes. You may also have this test if you have any symptoms or risk factors for gestational diabetes.  Talk with your health care provider about what your results mean. This information is not intended to replace advice given to you by your health care provider. Make sure you discuss any questions you have with your health care provider. Document Released: 08/25/2011 Document Revised: 06/16/2018 Document Reviewed: 10/05/2016 Elsevier Patient Education  2020 Elsevier Inc.  

## 2019-02-06 ENCOUNTER — Other Ambulatory Visit: Payer: Self-pay | Admitting: Certified Nurse Midwife

## 2019-02-06 LAB — CBC
Hematocrit: 30.8 % — ABNORMAL LOW (ref 34.0–46.6)
Hemoglobin: 10.3 g/dL — ABNORMAL LOW (ref 11.1–15.9)
MCH: 31.4 pg (ref 26.6–33.0)
MCHC: 33.4 g/dL (ref 31.5–35.7)
MCV: 94 fL (ref 79–97)
Platelets: 295 10*3/uL (ref 150–450)
RBC: 3.28 x10E6/uL — ABNORMAL LOW (ref 3.77–5.28)
RDW: 13.1 % (ref 11.7–15.4)
WBC: 12.2 10*3/uL — ABNORMAL HIGH (ref 3.4–10.8)

## 2019-02-06 LAB — ETHANOL

## 2019-02-06 LAB — GLUCOSE, 1 HOUR GESTATIONAL: Gestational Diabetes Screen: 89 mg/dL (ref 65–139)

## 2019-02-06 LAB — RPR: RPR Ser Ql: NONREACTIVE

## 2019-02-06 MED ORDER — FUSION PLUS PO CAPS: 1.0000 | ORAL_CAPSULE | Freq: Every day | ORAL | 3 refills | Status: DC

## 2019-02-06 NOTE — Progress Notes (Signed)
Anemia present. Orders placed for fusion plus. Pt notified via my chart.   Philip Aspen, CNM

## 2019-02-17 ENCOUNTER — Encounter: Payer: Self-pay | Admitting: Certified Nurse Midwife

## 2019-02-17 ENCOUNTER — Ambulatory Visit (INDEPENDENT_AMBULATORY_CARE_PROVIDER_SITE_OTHER): Payer: Medicaid Other | Admitting: Certified Nurse Midwife

## 2019-02-17 ENCOUNTER — Other Ambulatory Visit: Payer: Self-pay

## 2019-02-17 VITALS — BP 110/64 | HR 94 | Wt 168.3 lb

## 2019-02-17 DIAGNOSIS — Z3483 Encounter for supervision of other normal pregnancy, third trimester: Secondary | ICD-10-CM

## 2019-02-17 NOTE — Progress Notes (Signed)
ROB doing well. Has had a little nausea recently. Discussed self help measures. She declines medicatoin , stating it is not that bad. Feels good movement. Follow up 2 wks.   Philip Aspen, CNM

## 2019-02-17 NOTE — Patient Instructions (Signed)
How a Baby Grows During Pregnancy  Pregnancy begins when a female's sperm enters a female's egg (fertilization). Fertilization usually happens in one of the tubes (fallopian tubes) that connect the ovaries to the womb (uterus). The fertilized egg moves down the fallopian tube to the uterus. Once it reaches the uterus, it implants into the lining of the uterus and begins to grow. For the first 10 weeks, the fertilized egg is called an embryo. After 10 weeks, it is called a fetus. As the fetus continues to grow, it receives oxygen and nutrients through tissue (placenta) that grows to support the developing baby. The placenta is the life support system for the baby. It provides oxygen and nutrition and removes waste. Learning as much as you can about your pregnancy and how your baby is developing can help you enjoy the experience. It can also make you aware of when there might be a problem and when to ask questions. How long does a typical pregnancy last? A pregnancy usually lasts 280 days, or about 40 weeks. Pregnancy is divided into three periods of growth, also called trimesters:  First trimester: 0-12 weeks.  Second trimester: 13-27 weeks.  Third trimester: 28-40 weeks. The day when your baby is ready to be born (full term) is your estimated date of delivery. How does my baby develop month by month? First month  The fertilized egg attaches to the inside of the uterus.  Some cells will form the placenta. Others will form the fetus.  The arms, legs, brain, spinal cord, lungs, and heart begin to develop.  At the end of the first month, the heart begins to beat. Second month  The bones, inner ear, eyelids, hands, and feet form.  The genitals develop.  By the end of 8 weeks, all major organs are developing. Third month  All of the internal organs are forming.  Teeth develop below the gums.  Bones and muscles begin to grow. The spine can flex.  The skin is transparent.  Fingernails  and toenails begin to form.  Arms and legs continue to grow longer, and hands and feet develop.  The fetus is about 3 inches (7.6 cm) long. Fourth month  The placenta is completely formed.  The external sex organs, neck, outer ear, eyebrows, eyelids, and fingernails are formed.  The fetus can hear, swallow, and move its arms and legs.  The kidneys begin to produce urine.  The skin is covered with a white, waxy coating (vernix) and very fine hair (lanugo). Fifth month  The fetus moves around more and can be felt for the first time (quickening).  The fetus starts to sleep and wake up and may begin to suck its finger.  The nails grow to the end of the fingers.  The organ in the digestive system that makes bile (gallbladder) functions and helps to digest nutrients.  If your baby is a girl, eggs are present in her ovaries. If your baby is a boy, testicles start to move down into his scrotum. Sixth month  The lungs are formed.  The eyes open. The brain continues to develop.  Your baby has fingerprints and toe prints. Your baby's hair grows thicker.  At the end of the second trimester, the fetus is about 9 inches (22.9 cm) long. Seventh month  The fetus kicks and stretches.  The eyes are developed enough to sense changes in light.  The hands can make a grasping motion.  The fetus responds to sound. Eighth month  All   organs and body systems are fully developed and functioning.  Bones harden, and taste buds develop. The fetus may hiccup.  Certain areas of the brain are still developing. The skull remains soft. Ninth month  The fetus gains about  lb (0.23 kg) each week.  The lungs are fully developed.  Patterns of sleep develop.  The fetus's head typically moves into a head-down position (vertex) in the uterus to prepare for birth.  The fetus weighs 6-9 lb (2.72-4.08 kg) and is 19-20 inches (48.26-50.8 cm) long. What can I do to have a healthy pregnancy and help  my baby develop? General instructions  Take prenatal vitamins as directed by your health care provider. These include vitamins such as folic acid, iron, calcium, and vitamin D. They are important for healthy development.  Take medicines only as directed by your health care provider. Read labels and ask a pharmacist or your health care provider whether over-the-counter medicines, supplements, and prescription drugs are safe to take during pregnancy.  Keep all follow-up visits as directed by your health care provider. This is important. Follow-up visits include prenatal care and screening tests. How do I know if my baby is developing well? At each prenatal visit, your health care provider will do several different tests to check on your health and keep track of your baby's development. These include:  Fundal height and position. ? Your health care provider will measure your growing belly from your pubic bone to the top of the uterus using a tape measure. ? Your health care provider will also feel your belly to determine your baby's position.  Heartbeat. ? An ultrasound in the first trimester can confirm pregnancy and show a heartbeat, depending on how far along you are. ? Your health care provider will check your baby's heart rate at every prenatal visit.  Second trimester ultrasound. ? This ultrasound checks your baby's development. It also may show your baby's gender. What should I do if I have concerns about my baby's development? Always talk with your health care provider about any concerns that you may have about your pregnancy and your baby. Summary  A pregnancy usually lasts 280 days, or about 40 weeks. Pregnancy is divided into three periods of growth, also called trimesters.  Your health care provider will monitor your baby's growth and development throughout your pregnancy.  Follow your health care provider's recommendations about taking prenatal vitamins and medicines during  your pregnancy.  Talk with your health care provider if you have any concerns about your pregnancy or your developing baby. This information is not intended to replace advice given to you by your health care provider. Make sure you discuss any questions you have with your health care provider. Document Released: 08/12/2007 Document Revised: 06/16/2018 Document Reviewed: 01/06/2017 Elsevier Patient Education  2020 Elsevier Inc.  

## 2019-03-04 ENCOUNTER — Telehealth: Payer: Self-pay | Admitting: Certified Nurse Midwife

## 2019-03-04 NOTE — Telephone Encounter (Signed)
Pr Called the hospital seeking advice regarding pelvic pressure and pain . She is not breathing hard and is speaking clearly. She statse she has difficulty walking. Reviewed round ligament/pelvic pain . When inquiring about contractions she states she is having constant discomfort, when asking about leaking of fluid she admits to increase discharge. Pt advised to go to hospital for further evaluation. Pt state she is in [place] and that she was going to wait a little longer. Pt encouraged to go especially given that she is having increased discharge, urged her not to wait. She verbalizes understanding.   Philip Aspen, CNM

## 2019-03-06 ENCOUNTER — Telehealth: Payer: Self-pay

## 2019-03-06 NOTE — Telephone Encounter (Signed)
mychart message sent to patient

## 2019-03-07 ENCOUNTER — Other Ambulatory Visit: Payer: Self-pay

## 2019-03-07 ENCOUNTER — Encounter: Payer: Self-pay | Admitting: Certified Nurse Midwife

## 2019-03-07 ENCOUNTER — Ambulatory Visit (INDEPENDENT_AMBULATORY_CARE_PROVIDER_SITE_OTHER): Payer: Medicaid Other | Admitting: Certified Nurse Midwife

## 2019-03-07 VITALS — BP 90/63 | HR 96 | Wt 165.2 lb

## 2019-03-07 DIAGNOSIS — Z3483 Encounter for supervision of other normal pregnancy, third trimester: Secondary | ICD-10-CM

## 2019-03-07 LAB — POCT URINALYSIS DIPSTICK OB
Bilirubin, UA: NEGATIVE
Blood, UA: NEGATIVE
Glucose, UA: NEGATIVE
Ketones, UA: NEGATIVE
Leukocytes, UA: NEGATIVE
Nitrite, UA: NEGATIVE
POC,PROTEIN,UA: NEGATIVE
Spec Grav, UA: >=1.03 — AB (ref 1.010–1.025)
Urobilinogen, UA: 0.2 E.U./dL
pH, UA: 6 (ref 5.0–8.0)

## 2019-03-07 NOTE — Addendum Note (Signed)
Addended by: Raliegh Ip on: 03/07/2019 03:10 PM   Modules accepted: Orders

## 2019-03-07 NOTE — Patient Instructions (Signed)
Braxton Hicks Contractions Contractions of the uterus can occur throughout pregnancy, but they are not always a sign that you are in labor. You may have practice contractions called Braxton Hicks contractions. These false labor contractions are sometimes confused with true labor. What are Braxton Hicks contractions? Braxton Hicks contractions are tightening movements that occur in the muscles of the uterus before labor. Unlike true labor contractions, these contractions do not result in opening (dilation) and thinning of the cervix. Toward the end of pregnancy (32-34 weeks), Braxton Hicks contractions can happen more often and may become stronger. These contractions are sometimes difficult to tell apart from true labor because they can be very uncomfortable. You should not feel embarrassed if you go to the hospital with false labor. Sometimes, the only way to tell if you are in true labor is for your health care provider to look for changes in the cervix. The health care provider will do a physical exam and may monitor your contractions. If you are not in true labor, the exam should show that your cervix is not dilating and your water has not broken. If there are no other health problems associated with your pregnancy, it is completely safe for you to be sent home with false labor. You may continue to have Braxton Hicks contractions until you go into true labor. How to tell the difference between true labor and false labor True labor  Contractions last 30-70 seconds.  Contractions become very regular.  Discomfort is usually felt in the top of the uterus, and it spreads to the lower abdomen and low back.  Contractions do not go away with walking.  Contractions usually become more intense and increase in frequency.  The cervix dilates and gets thinner. False labor  Contractions are usually shorter and not as strong as true labor contractions.  Contractions are usually irregular.  Contractions  are often felt in the front of the lower abdomen and in the groin.  Contractions may go away when you walk around or change positions while lying down.  Contractions get weaker and are shorter-lasting as time goes on.  The cervix usually does not dilate or become thin. Follow these instructions at home:   Take over-the-counter and prescription medicines only as told by your health care provider.  Keep up with your usual exercises and follow other instructions from your health care provider.  Eat and drink lightly if you think you are going into labor.  If Braxton Hicks contractions are making you uncomfortable: ? Change your position from lying down or resting to walking, or change from walking to resting. ? Sit and rest in a tub of warm water. ? Drink enough fluid to keep your urine pale yellow. Dehydration may cause these contractions. ? Do slow and deep breathing several times an hour.  Keep all follow-up prenatal visits as told by your health care provider. This is important. Contact a health care provider if:  You have a fever.  You have continuous pain in your abdomen. Get help right away if:  Your contractions become stronger, more regular, and closer together.  You have fluid leaking or gushing from your vagina.  You pass blood-tinged mucus (bloody show).  You have bleeding from your vagina.  You have low back pain that you never had before.  You feel your baby's head pushing down and causing pelvic pressure.  Your baby is not moving inside you as much as it used to. Summary  Contractions that occur before labor are   called Braxton Hicks contractions, false labor, or practice contractions.  Braxton Hicks contractions are usually shorter, weaker, farther apart, and less regular than true labor contractions. True labor contractions usually become progressively stronger and regular, and they become more frequent.  Manage discomfort from Braxton Hicks contractions  by changing position, resting in a warm bath, drinking plenty of water, or practicing deep breathing. This information is not intended to replace advice given to you by your health care provider. Make sure you discuss any questions you have with your health care provider. Document Released: 07/09/2016 Document Revised: 02/05/2017 Document Reviewed: 07/09/2016 Elsevier Patient Education  2020 [employer]  

## 2019-03-07 NOTE — Progress Notes (Signed)
ROB doing well. Feels good movement. No concerns today.Reviewed GBS testing in 4 wks.  Follow up 2 wks.   Philip Aspen, CNM

## 2019-03-10 NOTE — L&D Delivery Note (Signed)
Delivery Note  1005 In room to see patient, reports pelvic pressure and the urge to have a bowel movement. SVE: 10/100/+2, vertex.   Effective maternal pushing efforts noted.   Spontaneous vaginal birth of liveborn female infant in left occiput anterior over intact perineum at 1012. Infant immediately to maternal abdomen. Delayed cord clamping, skin to skin, and three (3) vessel cord. APGARS: 9, 9. Weight pending. Receiving nurse present at bedside for birth.   IV pitocin infusion started. Delivery of intact placenta at 1017. Vaginal skinmarks hemostatic, no repair needed. Uterus firm. Rubra small. EBL: 300 ml. Anesthesia: epidural.   Initiate routine postpartum orders. Mom to postpartum.  Baby to Couplet care / Skin to Skin.  Father of baby present at bedside and overjoyed with the birth of "Valkon".   Continue orders as written. Reassess as needed.    Gunnar Bulla, CNM [facility], [facility] 05/02/2019, 10:27 AM

## 2019-03-22 ENCOUNTER — Encounter: Payer: Self-pay | Admitting: Certified Nurse Midwife

## 2019-03-22 ENCOUNTER — Other Ambulatory Visit: Payer: Self-pay

## 2019-03-22 ENCOUNTER — Ambulatory Visit (INDEPENDENT_AMBULATORY_CARE_PROVIDER_SITE_OTHER): Payer: Medicaid Other | Admitting: Certified Nurse Midwife

## 2019-03-22 VITALS — BP 108/68 | HR 112 | Wt 165.1 lb

## 2019-03-22 DIAGNOSIS — Z3483 Encounter for supervision of other normal pregnancy, third trimester: Secondary | ICD-10-CM

## 2019-03-22 NOTE — Patient Instructions (Signed)
Group B Streptococcus Infection During Pregnancy °Group B Streptococcus (GBS) is a type of bacteria that is often found in healthy people. It is commonly found in the rectum, vagina, and intestines. In people who are healthy and not pregnant, the bacteria rarely cause serious illness or complications. However, women who test positive for GBS during pregnancy can pass the bacteria to the baby during childbirth. This can cause serious infection in the baby after birth. °Women with GBS may also have infections during their pregnancy or soon after childbirth. The infections include urinary tract infections (UTIs) or infections of the uterus. GBS also increases a woman's risk of complications during pregnancy, such as early labor or delivery, miscarriage, or stillbirth. Routine testing for GBS is recommended for all pregnant women. °What are the causes? °This condition is caused by bacteria called Streptococcus agalactiae. °What increases the risk? °You may have a higher risk for GBS infection during pregnancy if you had one during a past pregnancy. °What are the signs or symptoms? °In most cases, GBS infection does not cause symptoms in pregnant women. If symptoms exist, they may include: °· Labor that starts before the 37th week of pregnancy. °· A UTI or bladder infection. This may cause a fever, frequent urination, or pain and burning during urination. °· Fever during labor. There can also be a rapid heartbeat in the mother or baby. °Rare but serious symptoms of a GBS infection in women include: °· Blood infection (septicemia). This may cause fever, chills, or confusion. °· Lung infection (pneumonia). This may cause fever, chills, cough, rapid breathing, chest pain, or difficulty breathing. °· Bone, joint, skin, or soft tissue infection. °How is this diagnosed? °You may be screened for GBS between week 35 and week 37 of pregnancy. If you have symptoms of preterm labor, you may be screened earlier. This condition is  diagnosed based on lab test results from: °· A swab of fluid from the vagina and rectum. °· A urine sample. °How is this treated? °This condition is treated with antibiotic medicine. Antibiotic medicine may be given: °· To you when you go into labor, or as soon as your water breaks. The medicines will continue until after you give birth. If you are having a cesarean delivery, you do not need antibiotics unless your water has broken. °· To your baby, if he or she requires treatment. Your health care provider will check your baby to decide if he or she needs antibiotics to prevent a serious infection. °Follow these instructions at home: °· Take over-the-counter and prescription medicines only as told by your health care provider. °· Take your antibiotic medicine as told by your health care provider. Do not stop taking the antibiotic even if you start to feel better. °· Keep all pre-birth (prenatal) visits and follow-up visits as told by your health care provider. This is important. °Contact a health care provider if: °· You have pain or burning when you urinate. °· You have to urinate more often than usual. °· You have a fever or chills. °· You develop a bad-smelling vaginal discharge. °Get help right away if: °· Your water breaks. °· You go into labor. °· You have severe pain in your abdomen. °· You have difficulty breathing. °· You have chest pain. °These symptoms may represent a serious problem that is an emergency. Do not wait to see if the symptoms will go away. Get medical help right away. Call your local emergency services (911 in the [place] Do not drive yourself to   the hospital. °Summary °· GBS is a type of bacteria that is common in healthy people. °· During pregnancy, colonization with GBS can cause serious complications for you or your baby. °· Your health care provider will screen you between 35 and 37 weeks of pregnancy to determine if you are colonized with GBS. °· If you are colonized with GBS during  pregnancy, your health care provider will recommend antibiotics through an IV during labor. °· After delivery, your baby will be evaluated for complications related to potential GBS infection and may require antibiotics to prevent a serious infection. °This information is not intended to replace advice given to you by your health care provider. Make sure you discuss any questions you have with your health care provider. °Document Revised: 09/19/2018 Document Reviewed: 09/19/2018 °Elsevier Patient Education © 2020 [employer] Inc. ° °

## 2019-03-22 NOTE — Progress Notes (Signed)
ROB doing well Feels good movement. Discussed GBS testing and vag exam nex visit. She verbalizes and agrees. Has some sciatic pain in her leg. Self help measures reviewed. Follow up 2 wks.   Doreene Burke, CNM

## 2019-04-07 ENCOUNTER — Encounter: Payer: Self-pay | Admitting: Certified Nurse Midwife

## 2019-04-07 ENCOUNTER — Other Ambulatory Visit: Payer: Self-pay

## 2019-04-07 ENCOUNTER — Ambulatory Visit (INDEPENDENT_AMBULATORY_CARE_PROVIDER_SITE_OTHER): Payer: Medicaid Other | Admitting: Certified Nurse Midwife

## 2019-04-07 ENCOUNTER — Other Ambulatory Visit: Payer: Self-pay | Admitting: Certified Nurse Midwife

## 2019-04-07 VITALS — BP 101/69 | HR 109 | Wt 167.5 lb

## 2019-04-07 DIAGNOSIS — Z3483 Encounter for supervision of other normal pregnancy, third trimester: Secondary | ICD-10-CM | POA: Diagnosis not present

## 2019-04-07 LAB — POCT URINALYSIS DIPSTICK OB
Bilirubin, UA: NEGATIVE
Blood, UA: NEGATIVE
Glucose, UA: NEGATIVE
Ketones, UA: NEGATIVE
Leukocytes, UA: NEGATIVE
Nitrite, UA: NEGATIVE
POC,PROTEIN,UA: NEGATIVE
Spec Grav, UA: 1.01 (ref 1.010–1.025)
Urobilinogen, UA: 0.2 E.U./dL
pH, UA: 5 (ref 5.0–8.0)

## 2019-04-07 LAB — OB RESULTS CONSOLE GC/CHLAMYDIA: Gonorrhea: NEGATIVE

## 2019-04-07 NOTE — Patient Instructions (Signed)
Group B Streptococcus Infection During Pregnancy °Group B Streptococcus (GBS) is a type of bacteria that is often found in healthy people. It is commonly found in the rectum, vagina, and intestines. In people who are healthy and not pregnant, the bacteria rarely cause serious illness or complications. However, women who test positive for GBS during pregnancy can pass the bacteria to the baby during childbirth. This can cause serious infection in the baby after birth. °Women with GBS may also have infections during their pregnancy or soon after childbirth. The infections include urinary tract infections (UTIs) or infections of the uterus. GBS also increases a woman's risk of complications during pregnancy, such as early labor or delivery, miscarriage, or stillbirth. Routine testing for GBS is recommended for all pregnant women. °What are the causes? °This condition is caused by bacteria called Streptococcus agalactiae. °What increases the risk? °You may have a higher risk for GBS infection during pregnancy if you had one during a past pregnancy. °What are the signs or symptoms? °In most cases, GBS infection does not cause symptoms in pregnant women. If symptoms exist, they may include: °· Labor that starts before the 37th week of pregnancy. °· A UTI or bladder infection. This may cause a fever, frequent urination, or pain and burning during urination. °· Fever during labor. There can also be a rapid heartbeat in the mother or baby. °Rare but serious symptoms of a GBS infection in women include: °· Blood infection (septicemia). This may cause fever, chills, or confusion. °· Lung infection (pneumonia). This may cause fever, chills, cough, rapid breathing, chest pain, or difficulty breathing. °· Bone, joint, skin, or soft tissue infection. °How is this diagnosed? °You may be screened for GBS between week 35 and week 37 of pregnancy. If you have symptoms of preterm labor, you may be screened earlier. This condition is  diagnosed based on lab test results from: °· A swab of fluid from the vagina and rectum. °· A urine sample. °How is this treated? °This condition is treated with antibiotic medicine. Antibiotic medicine may be given: °· To you when you go into labor, or as soon as your water breaks. The medicines will continue until after you give birth. If you are having a cesarean delivery, you do not need antibiotics unless your water has broken. °· To your baby, if he or she requires treatment. Your health care provider will check your baby to decide if he or she needs antibiotics to prevent a serious infection. °Follow these instructions at home: °· Take over-the-counter and prescription medicines only as told by your health care provider. °· Take your antibiotic medicine as told by your health care provider. Do not stop taking the antibiotic even if you start to feel better. °· Keep all pre-birth (prenatal) visits and follow-up visits as told by your health care provider. This is important. °Contact a health care provider if: °· You have pain or burning when you urinate. °· You have to urinate more often than usual. °· You have a fever or chills. °· You develop a bad-smelling vaginal discharge. °Get help right away if: °· Your water breaks. °· You go into labor. °· You have severe pain in your abdomen. °· You have difficulty breathing. °· You have chest pain. °These symptoms may represent a serious problem that is an emergency. Do not wait to see if the symptoms will go away. Get medical help right away. Call your local emergency services (911 in the [place] Do not drive yourself to   the hospital. °Summary °· GBS is a type of bacteria that is common in healthy people. °· During pregnancy, colonization with GBS can cause serious complications for you or your baby. °· Your health care provider will screen you between 35 and 37 weeks of pregnancy to determine if you are colonized with GBS. °· If you are colonized with GBS during  pregnancy, your health care provider will recommend antibiotics through an IV during labor. °· After delivery, your baby will be evaluated for complications related to potential GBS infection and may require antibiotics to prevent a serious infection. °This information is not intended to replace advice given to you by your health care provider. Make sure you discuss any questions you have with your health care provider. °Document Revised: 09/19/2018 Document Reviewed: 09/19/2018 °Elsevier Patient Education © 2020 [employer] Inc. ° °

## 2019-04-07 NOTE — Progress Notes (Signed)
ROB doing well. Feels good movement. GBS and cultures today. SVE 3/50/-2. Vertex. Labor precautions reviewed. Herbal prep handout given. Discussed labor plan, request epidural/plans breast feed and his name is Valdon.  Labor precautions reviewed. Follow up 1 wk.   Doreene Burke, CNM

## 2019-04-09 LAB — STREP GP B NAA: Strep Gp B NAA: NEGATIVE

## 2019-04-09 LAB — GC/CHLAMYDIA PROBE AMP
Chlamydia trachomatis, NAA: NEGATIVE
Neisseria Gonorrhoeae by PCR: NEGATIVE

## 2019-04-14 ENCOUNTER — Observation Stay
Admission: EM | Admit: 2019-04-14 | Discharge: 2019-04-14 | Disposition: A | Discharge status: Home / Self Care | Payer: Medicaid Other | Attending: Obstetrics and Gynecology | Admitting: Obstetrics and Gynecology

## 2019-04-14 ENCOUNTER — Encounter: Payer: Self-pay | Admitting: Obstetrics and Gynecology

## 2019-04-14 ENCOUNTER — Other Ambulatory Visit: Payer: Self-pay

## 2019-04-14 DIAGNOSIS — Z3A38 38 weeks gestation of pregnancy: Secondary | ICD-10-CM | POA: Diagnosis not present

## 2019-04-14 DIAGNOSIS — O471 False labor at or after 37 completed weeks of gestation: Secondary | ICD-10-CM | POA: Diagnosis not present

## 2019-04-14 NOTE — OB Triage Note (Signed)
Ctx x 3 days. Denies LOF, vaginal bleeding. Reports good fetal movement. Tamara Barron

## 2019-04-16 NOTE — Discharge Summary (Signed)
    L&D OB Triage Note  SUBJECTIVE Tamara Barron is a 24 y.o. G2P1001 female at [redacted]w[redacted]d, EDD Estimated Date of Delivery: 04/29/19 who presented to triage with complaints of contractions every 5 to 10 minutes.  Denies vaginal bleeding denies leakage of fluid.   OB History  Gravida Para Term Preterm AB Living  2 1 1  0 0 1  SAB TAB Ectopic Multiple Live Births  0 0 0 0 1    # Outcome Date GA Lbr Len/2nd Weight Sex Delivery Anes PTL Lv  2 Current           1 Term 12/05/14 [redacted]w[redacted]d  2920 g  Vag-Spont  N LIV    No medications prior to admission.     OBJECTIVE  Nursing Evaluation:   Temp 98.8 F (37.1 C) (Oral)   Resp 18   Ht 5\' 7"  (1.702 m)   Wt 76.2 kg   LMP 07/23/2018 (Approximate)   BMI 26.31 kg/m    Findings:        No cervical change since office check.     Irregular contractions-patient not in labor      NST was performed and has been reviewed by me.  NST INTERPRETATION: Category I  Mode: External Baseline Rate (A): 135 bpm Variability: Moderate Accelerations: 15 x 15 Decelerations: None     Contraction Frequency (min): ctx x 1 with ui  ASSESSMENT Impression:  1.  Pregnancy:  G2P1001 at [redacted]w[redacted]d , EDD Estimated Date of Delivery: 04/29/19 2.  Reassuring fetal and maternal status  PLAN 1. Discussed current condition and above findings with patient and reassurance given.  All questions answered. 2. Discharge home with standard labor precautions given to return to L&D or call the office for problems. 3. Continue routine prenatal care.

## 2019-04-17 ENCOUNTER — Other Ambulatory Visit: Payer: Self-pay

## 2019-04-17 ENCOUNTER — Ambulatory Visit (INDEPENDENT_AMBULATORY_CARE_PROVIDER_SITE_OTHER): Payer: Medicaid Other | Admitting: Certified Nurse Midwife

## 2019-04-17 ENCOUNTER — Encounter: Payer: Self-pay | Admitting: Certified Nurse Midwife

## 2019-04-17 VITALS — BP 106/67 | HR 91 | Wt 167.2 lb

## 2019-04-17 DIAGNOSIS — Z3483 Encounter for supervision of other normal pregnancy, third trimester: Secondary | ICD-10-CM

## 2019-04-17 NOTE — Progress Notes (Signed)
ROB doing well. Feels good movement. SVE per pt request 4/70/-2 . Labor precautions reviewed. Follow up 1 wk with Marcelino Duster.

## 2019-04-17 NOTE — Patient Instructions (Signed)
Braxton Hicks Contractions °Contractions of the uterus can occur throughout pregnancy, but they are not always a sign that you are in labor. You may have practice contractions called Braxton Hicks contractions. These false labor contractions are sometimes confused with true labor. °What are Braxton Hicks contractions? °Braxton Hicks contractions are tightening movements that occur in the muscles of the uterus before labor. Unlike true labor contractions, these contractions do not result in opening (dilation) and thinning of the cervix. Toward the end of pregnancy (32-34 weeks), Braxton Hicks contractions can happen more often and may become stronger. These contractions are sometimes difficult to tell apart from true labor because they can be very uncomfortable. You should not feel embarrassed if you go to the hospital with false labor. °Sometimes, the only way to tell if you are in true labor is for your health care provider to look for changes in the cervix. The health care provider will do a physical exam and may monitor your contractions. If you are not in true labor, the exam should show that your cervix is not dilating and your water has not broken. °If there are no other health problems associated with your pregnancy, it is completely safe for you to be sent home with false labor. You may continue to have Braxton Hicks contractions until you go into true labor. °How to tell the difference between true labor and false labor °True labor °· Contractions last 30-70 seconds. °· Contractions become very regular. °· Discomfort is usually felt in the top of the uterus, and it spreads to the lower abdomen and low back. °· Contractions do not go away with walking. °· Contractions usually become more intense and increase in frequency. °· The cervix dilates and gets thinner. °False labor °· Contractions are usually shorter and not as strong as true labor contractions. °· Contractions are usually irregular. °· Contractions  are often felt in the front of the lower abdomen and in the groin. °· Contractions may go away when you walk around or change positions while lying down. °· Contractions get weaker and are shorter-lasting as time goes on. °· The cervix usually does not dilate or become thin. °Follow these instructions at home: ° °· Take over-the-counter and prescription medicines only as told by your health care provider. °· Keep up with your usual exercises and follow other instructions from your health care provider. °· Eat and drink lightly if you think you are going into labor. °· If Braxton Hicks contractions are making you uncomfortable: °? Change your position from lying down or resting to walking, or change from walking to resting. °? Sit and rest in a tub of warm water. °? Drink enough fluid to keep your urine pale yellow. Dehydration may cause these contractions. °? Do slow and deep breathing several times an hour. °· Keep all follow-up prenatal visits as told by your health care provider. This is important. °Contact a health care provider if: °· You have a fever. °· You have continuous pain in your abdomen. °Get help right away if: °· Your contractions become stronger, more regular, and closer together. °· You have fluid leaking or gushing from your vagina. °· You pass blood-tinged mucus (bloody show). °· You have bleeding from your vagina. °· You have low back pain that you never had before. °· You feel your baby’s head pushing down and causing pelvic pressure. °· Your baby is not moving inside you as much as it used to. °Summary °· Contractions that occur before labor are   called Braxton Hicks contractions, false labor, or practice contractions. °· Braxton Hicks contractions are usually shorter, weaker, farther apart, and less regular than true labor contractions. True labor contractions usually become progressively stronger and regular, and they become more frequent. °· Manage discomfort from Braxton Hicks contractions  by changing position, resting in a warm bath, drinking plenty of water, or practicing deep breathing. °This information is not intended to replace advice given to you by your health care provider. Make sure you discuss any questions you have with your health care provider. °Document Revised: 02/05/2017 Document Reviewed: 07/09/2016 °Elsevier Patient Education © 2020 [employer] Inc. ° °

## 2019-04-17 NOTE — Lactation Note (Signed)
Lactation Consultation Note  Patient Name: Tamara Barron [id number] Date: 04/17/2019   Lactation student initiated discussion about benefits of breastfeeding per the [employer] curriculum. Tamara Barron encouraged to review breastfeeding information on Ready, set, [employer] site and given information for virtual breastfeeding classes.    Maternal Data    Feeding    LATCH Score                   Interventions    Lactation Tools Discussed/Used     Consult Status      Tamara Barron 04/17/2019, 4:01 PM

## 2019-04-24 ENCOUNTER — Other Ambulatory Visit: Payer: Self-pay

## 2019-04-24 ENCOUNTER — Ambulatory Visit (INDEPENDENT_AMBULATORY_CARE_PROVIDER_SITE_OTHER): Payer: Medicaid Other | Admitting: Certified Nurse Midwife

## 2019-04-24 VITALS — BP 101/72 | HR 101 | Wt 168.9 lb

## 2019-04-24 DIAGNOSIS — O480 Post-term pregnancy: Secondary | ICD-10-CM

## 2019-04-24 DIAGNOSIS — Z3493 Encounter for supervision of normal pregnancy, unspecified, third trimester: Secondary | ICD-10-CM

## 2019-04-24 DIAGNOSIS — Z3A39 39 weeks gestation of pregnancy: Secondary | ICD-10-CM

## 2019-04-24 LAB — POCT URINALYSIS DIPSTICK OB
Bilirubin, UA: NEGATIVE
Blood, UA: NEGATIVE
Glucose, UA: NEGATIVE
Ketones, UA: NEGATIVE
Nitrite, UA: NEGATIVE
POC,PROTEIN,UA: NEGATIVE
Spec Grav, UA: 1.015 (ref 1.010–1.025)
Urobilinogen, UA: 0.2 E.U./dL
pH, UA: 6 (ref 5.0–8.0)

## 2019-04-24 NOTE — Patient Instructions (Signed)
Vaginal Delivery  Vaginal delivery means that you give birth by pushing your baby out of your birth canal (vagina). A team of health care providers will help you before, during, and after vaginal delivery. Birth experiences are unique for every woman and every pregnancy, and birth experiences vary depending on where you choose to give birth. What happens when I arrive at the birth center or hospital? Once you are in labor and have been admitted into the hospital or birth center, your health care provider may:  Review your pregnancy history and any concerns that you have.  Insert an IV into one of your veins. This may be used to give you fluids and medicines.  Check your blood pressure, pulse, temperature, and heart rate (vital signs).  Check whether your bag of water (amniotic sac) has broken (ruptured).  Talk with you about your birth plan and discuss pain control options. Monitoring Your health care provider may monitor your contractions (uterine monitoring) and your baby's heart rate (fetal monitoring). You may need to be monitored:  Often, but not continuously (intermittently).  All the time or for long periods at a time (continuously). Continuous monitoring may be needed if: ? You are taking certain medicines, such as medicine to relieve pain or make your contractions stronger. ? You have pregnancy or labor complications. Monitoring may be done by:  Placing a special stethoscope or a handheld monitoring device on your abdomen to check your baby's heartbeat and to check for contractions.  Placing monitors on your abdomen (external monitors) to record your baby's heartbeat and the frequency and length of contractions.  Placing monitors inside your uterus through your vagina (internal monitors) to record your baby's heartbeat and the frequency, length, and strength of your contractions. Depending on the type of monitor, it may remain in your uterus or on your baby's head until  birth.  Telemetry. This is a type of continuous monitoring that can be done with external or internal monitors. Instead of having to stay in bed, you are able to move around during telemetry. Physical exam Your health care provider may perform frequent physical exams. This may include:  Checking how and where your baby is positioned in your uterus.  Checking your cervix to determine: ? Whether it is thinning out (effacing). ? Whether it is opening up (dilating). What happens during labor and delivery?  Normal labor and delivery is divided into the following three stages: Stage 1  This is the longest stage of labor.  This stage can last for hours or days.  Throughout this stage, you will feel contractions. Contractions generally feel mild, infrequent, and irregular at first. They get stronger, more frequent (about every 2-3 minutes), and more regular as you move through this stage.  This stage ends when your cervix is completely dilated to 4 inches (10 cm) and completely effaced. Stage 2  This stage starts once your cervix is completely effaced and dilated and lasts until the delivery of your baby.  This stage may last from 20 minutes to 2 hours.  This is the stage where you will feel an urge to push your baby out of your vagina.  You may feel stretching and burning pain, especially when the widest part of your baby's head passes through the vaginal opening (crowning).  Once your baby is delivered, the umbilical cord will be clamped and cut. This usually occurs after waiting a period of 1-2 minutes after delivery.  Your baby will be placed on your bare chest (  skin-to-skin contact) in an upright position and covered with a warm blanket. Watch your baby for feeding cues, like rooting or sucking, and help the baby to your breast for his or her first feeding. Stage 3  This stage starts immediately after the birth of your baby and ends after you deliver the placenta.  This stage may  take anywhere from 5 to 30 minutes.  After your baby has been delivered, you will feel contractions as your body expels the placenta and your uterus contracts to control bleeding. What can I expect after labor and delivery?  After labor is over, you and your baby will be monitored closely until you are ready to go home to ensure that you are both healthy. Your health care team will teach you how to care for yourself and your baby.  You and your baby will stay in the same room (rooming in) during your hospital stay. This will encourage early bonding and successful breastfeeding.  You may continue to receive fluids and medicines through an IV.  Your uterus will be checked and massaged regularly (fundal massage).  You will have some soreness and pain in your abdomen, vagina, and the area of skin between your vaginal opening and your anus (perineum).  If an incision was made near your vagina (episiotomy) or if you had some vaginal tearing during delivery, cold compresses may be placed on your episiotomy or your tear. This helps to reduce pain and swelling.  You may be given a squirt bottle to use instead of wiping when you go to the bathroom. To use the squirt bottle, follow these steps: ? Before you urinate, fill the squirt bottle with warm water. Do not use hot water. ? After you urinate, while you are sitting on the toilet, use the squirt bottle to rinse the area around your urethra and vaginal opening. This rinses away any urine and blood. ? Fill the squirt bottle with clean water every time you use the bathroom.  It is normal to have vaginal bleeding after delivery. Wear a sanitary pad for vaginal bleeding and discharge. Summary  Vaginal delivery means that you will give birth by pushing your baby out of your birth canal (vagina).  Your health care provider may monitor your contractions (uterine monitoring) and your baby's heart rate (fetal monitoring).  Your health care provider may  perform a physical exam.  Normal labor and delivery is divided into three stages.  After labor is over, you and your baby will be monitored closely until you are ready to go home. This information is not intended to replace advice given to you by your health care provider. Make sure you discuss any questions you have with your health care provider. Document Revised: 03/30/2017 Document Reviewed: 03/30/2017 Elsevier Patient Education  2020 Elsevier Inc.    Fetal Movement Counts Patient Name: ________________________________________________ Patient Due Date: ____________________ What is a fetal movement count?  A fetal movement count is the number of times that you feel your baby move during a certain amount of time. This may also be called a fetal kick count. A fetal movement count is recommended for every pregnant woman. You may be asked to start counting fetal movements as early as week 28 of your pregnancy. Pay attention to when your baby is most active. You may notice your baby's sleep and wake cycles. You may also notice things that make your baby move more. You should do a fetal movement count:  When your baby is normally most   active.  At the same time each day. A good time to count movements is while you are resting, after having something to eat and drink. How do I count fetal movements? 1. Find a quiet, comfortable area. Sit, or lie down on your side. 2. Write down the date, the start time and stop time, and the number of movements that you felt between those two times. Take this information with you to your health care visits. 3. Write down your start time when you feel the first movement. 4. Count kicks, flutters, swishes, rolls, and jabs. You should feel at least 10 movements. 5. You may stop counting after you have felt 10 movements, or if you have been counting for 2 hours. Write down the stop time. 6. If you do not feel 10 movements in 2 hours, contact your health care  provider for further instructions. Your health care provider may want to do additional tests to assess your baby's well-being. Contact a health care provider if:  You feel fewer than 10 movements in 2 hours.  Your baby is not moving like he or she usually does. Date: ____________ Start time: ____________ Stop time: ____________ Movements: ____________ Date: ____________ Start time: ____________ Stop time: ____________ Movements: ____________ Date: ____________ Start time: ____________ Stop time: ____________ Movements: ____________ Date: ____________ Start time: ____________ Stop time: ____________ Movements: ____________ Date: ____________ Start time: ____________ Stop time: ____________ Movements: ____________ Date: ____________ Start time: ____________ Stop time: ____________ Movements: ____________ Date: ____________ Start time: ____________ Stop time: ____________ Movements: ____________ Date: ____________ Start time: ____________ Stop time: ____________ Movements: ____________ Date: ____________ Start time: ____________ Stop time: ____________ Movements: ____________ This information is not intended to replace advice given to you by your health care provider. Make sure you discuss any questions you have with your health care provider. Document Revised: 10/13/2018 Document Reviewed: 10/13/2018 Elsevier Patient Education  2020 Elsevier Inc.  

## 2019-04-24 NOTE — Lactation Note (Signed)
Lactation Consultation Note  Patient Name: Tamara Barron KZLDJ'T Date: 04/24/2019   Lactation student discussed benefits of breastfeeding per the Ready, Set, Baby curriculum. Tamara Barron encouraged to review breastfeeding information on Ready, set, [employer] site and given information for virtual breastfeeding classes.   Maternal Data    Feeding    LATCH Score                   Interventions    Lactation Tools Discussed/Used     Consult Status      Cornelious Bryant 04/24/2019, 11:56 AM

## 2019-04-24 NOTE — Progress Notes (Signed)
ROB-Patient c/o irregular UC's, intermittent pelvic and lower back pain.

## 2019-04-24 NOTE — Progress Notes (Signed)
ROB-Reports irregular contractions and intermittent pelvic and lower back pain. Requests membrane sweep. SVE unchanged since previous exam. Discussed home labor preparation techniques. Encouraged bowel movement. Copy of [employer] given. Anticipatory guidance regarding course of prenatal care. Reviewed red flag symptoms and when to call. RTC x 1 week for growth ultrasound, BPP and ROB or sooner if needed.

## 2019-04-27 ENCOUNTER — Ambulatory Visit (INDEPENDENT_AMBULATORY_CARE_PROVIDER_SITE_OTHER): Payer: Medicaid Other | Admitting: Certified Nurse Midwife

## 2019-04-27 ENCOUNTER — Encounter: Payer: Self-pay | Admitting: Certified Nurse Midwife

## 2019-04-27 ENCOUNTER — Other Ambulatory Visit: Payer: Self-pay

## 2019-04-27 ENCOUNTER — Observation Stay
Admission: EM | Admit: 2019-04-27 | Discharge: 2019-04-27 | Disposition: A | Discharge status: Home / Self Care | Payer: Medicaid Other | Attending: Certified Nurse Midwife | Admitting: Certified Nurse Midwife

## 2019-04-27 VITALS — BP 99/77 | HR 101 | Wt 171.5 lb

## 2019-04-27 DIAGNOSIS — O3463 Maternal care for abnormality of vagina, third trimester: Secondary | ICD-10-CM

## 2019-04-27 DIAGNOSIS — Z3A39 39 weeks gestation of pregnancy: Secondary | ICD-10-CM

## 2019-04-27 DIAGNOSIS — O99013 Anemia complicating pregnancy, third trimester: Principal | ICD-10-CM | POA: Insufficient documentation

## 2019-04-27 DIAGNOSIS — N898 Other specified noninflammatory disorders of vagina: Secondary | ICD-10-CM

## 2019-04-27 DIAGNOSIS — Z3493 Encounter for supervision of normal pregnancy, unspecified, third trimester: Secondary | ICD-10-CM

## 2019-04-27 DIAGNOSIS — O26893 Other specified pregnancy related conditions, third trimester: Secondary | ICD-10-CM

## 2019-04-27 LAB — POCT URINALYSIS DIPSTICK OB
Bilirubin, UA: NEGATIVE
Blood, UA: NEGATIVE
Glucose, UA: NEGATIVE
Ketones, UA: NEGATIVE
Nitrite, UA: NEGATIVE
POC,PROTEIN,UA: NEGATIVE
Spec Grav, UA: 1.01 (ref 1.010–1.025)
Urobilinogen, UA: 0.2 E.U./dL
pH, UA: 7.5 (ref 5.0–8.0)

## 2019-04-27 LAB — RUPTURE OF MEMBRANE (ROM)PLUS: Rom Plus: NEGATIVE

## 2019-04-27 NOTE — OB Triage Note (Signed)
Pt. Presented from office with reported LOF that began last night. She describes it as clear and watery. She not wearing a pad, but does notice that the fluid continues when she wipes. No bleeding. Positive fetal movement. She reports intermittent lower abdominal and back pain, rated 4/10, that has been ongoing for a few weeks.  Last intercourse 04/26/19. Pt stable at this time; monitors applied and assessing.

## 2019-04-27 NOTE — Progress Notes (Signed)
Patient c/o possible ROM, started leaking last night after intercourse.

## 2019-04-27 NOTE — Discharge Summary (Signed)
  Obstetric Discharge Summary  Patient ID: Tamara Barron MRN: [id number] DOB/AGE: January 20, 1996 23 y.o.   Date of Admission: 04/27/2019  Date of Discharge:  04/27/19  Admitting Diagnosis: Observation at [redacted]w[redacted]d  Secondary Diagnosis: Anemia in pregnancy     Discharge Diagnosis: No other diagnosis   Antepartum Procedures: NST and ROM Plus    Brief Hospital Course   L&D OB Triage Note  Tamara Barron is a 24 y.o. G29P1001 female at [redacted]w[redacted]d, EDD Estimated Date of Delivery: 04/29/19 who presented to triage for complaints of leakage of fluid. She was sent to [facility] from the office for ROM plus after negative testing for rupture of membranes with continued concerns for vaginal leakage.    She was evaluated by the nurses with no significant findings for fetal distress, active labor, and spontaneous rupture of membranes-negative ROM pluas.   Vital signs stable. An NST was performed and has been reviewed by CNM. No treatment was required.   NST INTERPRETATION:  Indications: rule out uterine contractions  Mode: External Baseline Rate (A): 135 bpm Variability: Moderate Accelerations: 15 x 15 Decelerations: None  Contraction Frequency (min): 2-6 with UI  Impression: reactive  ROM Plus ([facility] only)     Status: None   Collection Time: 04/27/19 12:56 PM  Result Value Ref Range   Rom Plus NEGATIVE     Comment: Performed at [facility], [address], [place], [place] [address]    Plan: NST performed was reviewed and was found to be reactive. She was discharged home with bleeding/labor precautions.  Continue routine prenatal care. Follow up with CNM as previously scheduled.    Discharge Instructions: Per After Visit Summary.  Activity: Also refer to After Visit Summary.  Diet: Regular  Medications: Allergies as of 04/27/2019      Reactions   Oxycodone Hcl Rash      Medication List    TAKE these medications   ferrous sulfate 325 (65 FE) MG tablet Take 325 mg  by mouth daily with breakfast.   multivitamin-prenatal 27-0.8 MG Tabs tablet Take 1 tablet by mouth daily at 12 noon.      Outpatient follow up: As previously scheduled  Discharged Condition: stable  Discharged to: home   Gunnar Bulla, CNM [facility], [facility] 04/27/19 1:49 PM

## 2019-04-27 NOTE — Patient Instructions (Signed)
Fetal Movement Counts Patient Name: ________________________________________________ Patient Due Date: ____________________ What is a fetal movement count?  A fetal movement count is the number of times that you feel your baby move during a certain amount of time. This may also be called a fetal kick count. A fetal movement count is recommended for every pregnant woman. You may be asked to start counting fetal movements as early as week 28 of your pregnancy. Pay attention to when your baby is most active. You may notice your baby's sleep and wake cycles. You may also notice things that make your baby move more. You should do a fetal movement count:  When your baby is normally most active.  At the same time each day. A good time to count movements is while you are resting, after having something to eat and drink. How do I count fetal movements? 1. Find a quiet, comfortable area. Sit, or lie down on your side. 2. Write down the date, the start time and stop time, and the number of movements that you felt between those two times. Take this information with you to your health care visits. 3. Write down your start time when you feel the first movement. 4. Count kicks, flutters, swishes, rolls, and jabs. You should feel at least 10 movements. 5. You may stop counting after you have felt 10 movements, or if you have been counting for 2 hours. Write down the stop time. 6. If you do not feel 10 movements in 2 hours, contact your health care provider for further instructions. Your health care provider may want to do additional tests to assess your baby's well-being. Contact a health care provider if:  You feel fewer than 10 movements in 2 hours.  Your baby is not moving like he or she usually does. Date: ____________ Start time: ____________ Stop time: ____________ Movements: ____________ Date: ____________ Start time: ____________ Stop time: ____________ Movements: ____________ Date: ____________  Start time: ____________ Stop time: ____________ Movements: ____________ Date: ____________ Start time: ____________ Stop time: ____________ Movements: ____________ Date: ____________ Start time: ____________ Stop time: ____________ Movements: ____________ Date: ____________ Start time: ____________ Stop time: ____________ Movements: ____________ Date: ____________ Start time: ____________ Stop time: ____________ Movements: ____________ Date: ____________ Start time: ____________ Stop time: ____________ Movements: ____________ Date: ____________ Start time: ____________ Stop time: ____________ Movements: ____________ This information is not intended to replace advice given to you by your health care provider. Make sure you discuss any questions you have with your health care provider. Document Revised: 10/13/2018 Document Reviewed: 10/13/2018 Elsevier Patient Education  2020 Elsevier Inc.  

## 2019-04-27 NOTE — Progress Notes (Signed)
Subjective:   Mikia Delaluz is a 24 y.o. G2P1001 [redacted]w[redacted]d being seen today for work in problem obstetrical visit.  Patient reports leakage of fluid from vagina; questions SROM since last night at 2200. Notes irregular contractions. Denies vaginal bleeding.  Reports good fetal movement.  Denies difficulty breathing or respiratory distress, chest pain, dysuria, and leg pain or swelling.   The following portions of the patient's history were reviewed and updated as appropriate: allergies, current medications, past family history, past medical history, past social history, past surgical history and problem list.   Objective:   BP 99/77   Pulse (!) 101   Wt 171 lb 8 oz (77.8 kg)   LMP 07/23/2018 (Approximate)   BMI 26.86 kg/m   FHT: Fetal Heart Rate (bpm): 135  Fetal Movement: Movement: Present  Presentation: Presentation: Vertex    Abdomen:  soft, gravid, appropriate for gestational age,non-tender  Vaginal:  Discharge, clear; fern negative, no pooling; nitrazine equivocal   Cervix: 4 cm,75%,-2, soft,middle    Results for orders placed or performed in visit on 04/27/19 (from the past 24 hour(s))  POC Urinalysis Dipstick OB     Status: Abnormal   Collection Time: 04/27/19 11:48 AM  Result Value Ref Range   Color, UA Yellow    Clarity, UA Clear    Glucose, UA Negative Negative   Bilirubin, UA Negative    Ketones, UA Negative    Spec Grav, UA 1.010 1.010 - 1.025   Blood, UA Negative    pH, UA 7.5 5.0 - 8.0   POC,PROTEIN,UA Negative Negative, Trace, Small (1+), Moderate (2+), Large (3+), 4+   Urobilinogen, UA 0.2 0.2 or 1.0 E.U./dL   Nitrite, UA Negative    Leukocytes, UA Moderate (2+) (A) Negative   Appearance     Odor      Assessment:   Pregnancy:  G2P1001 at [redacted]w[redacted]d  1. Third trimester pregnancy   2. Vaginal discharge during pregnancy in third trimester   Plan:   Discussed results will patient. Agrees to go to [employer] for [employer]   Reports given to [facility], Charity fundraiser.    Follow up as previously scheduled.    Gunnar Bulla, CNM [facility], [facility] 04/27/19 12:16 PM

## 2019-05-02 ENCOUNTER — Other Ambulatory Visit: Payer: Medicaid Other

## 2019-05-02 ENCOUNTER — Other Ambulatory Visit: Payer: Self-pay

## 2019-05-02 ENCOUNTER — Encounter: Payer: Self-pay | Admitting: Obstetrics and Gynecology

## 2019-05-02 ENCOUNTER — Inpatient Hospital Stay
Admission: EM | Admit: 2019-05-02 | Discharge: 2019-05-03 | DRG: 807 | Disposition: A | Discharge status: Home / Self Care | Payer: Medicaid Other | Attending: Certified Nurse Midwife | Admitting: Certified Nurse Midwife

## 2019-05-02 ENCOUNTER — Inpatient Hospital Stay: Payer: Medicaid Other | Admitting: Anesthesiology

## 2019-05-02 ENCOUNTER — Encounter: Payer: Medicaid Other | Admitting: Certified Nurse Midwife

## 2019-05-02 DIAGNOSIS — F17210 Nicotine dependence, cigarettes, uncomplicated: Secondary | ICD-10-CM | POA: Diagnosis not present

## 2019-05-02 DIAGNOSIS — D649 Anemia, unspecified: Secondary | ICD-10-CM | POA: Diagnosis present

## 2019-05-02 DIAGNOSIS — O480 Post-term pregnancy: Secondary | ICD-10-CM | POA: Diagnosis not present

## 2019-05-02 DIAGNOSIS — Z20822 Contact with and (suspected) exposure to covid-19: Secondary | ICD-10-CM | POA: Diagnosis not present

## 2019-05-02 DIAGNOSIS — O9902 Anemia complicating childbirth: Principal | ICD-10-CM | POA: Diagnosis present

## 2019-05-02 DIAGNOSIS — O99334 Smoking (tobacco) complicating childbirth: Secondary | ICD-10-CM | POA: Diagnosis not present

## 2019-05-02 DIAGNOSIS — Z3A40 40 weeks gestation of pregnancy: Secondary | ICD-10-CM | POA: Diagnosis not present

## 2019-05-02 DIAGNOSIS — O26893 Other specified pregnancy related conditions, third trimester: Secondary | ICD-10-CM | POA: Diagnosis not present

## 2019-05-02 LAB — CBC
HCT: 31.1 % — ABNORMAL LOW (ref 36.0–46.0)
Hemoglobin: 10.6 g/dL — ABNORMAL LOW (ref 12.0–15.0)
MCH: 31.1 pg (ref 26.0–34.0)
MCHC: 34.1 g/dL (ref 30.0–36.0)
MCV: 91.2 fL (ref 80.0–100.0)
Platelets: 253 10*3/uL (ref 150–400)
RBC: 3.41 MIL/uL — ABNORMAL LOW (ref 3.87–5.11)
RDW: 13.2 % (ref 11.5–15.5)
WBC: 18.5 10*3/uL — ABNORMAL HIGH (ref 4.0–10.5)
nRBC: 0 % (ref 0.0–0.2)

## 2019-05-02 LAB — RESPIRATORY PANEL BY RT PCR (FLU A&B, COVID)
Influenza A by PCR: NEGATIVE
Influenza B by PCR: NEGATIVE
SARS Coronavirus 2 by RT PCR: NEGATIVE

## 2019-05-02 LAB — ABO/RH: ABO/RH(D): A POS

## 2019-05-02 LAB — RAPID HIV SCREEN (HIV 1/2 AB+AG)
HIV 1/2 Antibodies: NONREACTIVE
HIV-1 P24 Antigen - HIV24: NONREACTIVE

## 2019-05-02 LAB — TYPE AND SCREEN
ABO/RH(D): A POS
Antibody Screen: NEGATIVE

## 2019-05-02 LAB — RPR: RPR Ser Ql: NONREACTIVE

## 2019-05-02 LAB — OB RESULTS CONSOLE HIV ANTIBODY (ROUTINE TESTING): HIV: NONREACTIVE

## 2019-05-02 MED ORDER — PRENATAL MULTIVITAMIN CH
1.0000 | ORAL_TABLET | Freq: Every day | ORAL | Status: DC
  Administered 2019-05-03: 1 via ORAL
  Filled 2019-05-02: qty 1

## 2019-05-02 MED ORDER — SENNOSIDES-DOCUSATE SODIUM 8.6-50 MG PO TABS
2.0000 | ORAL_TABLET | ORAL | Status: DC
  Administered 2019-05-02: 23:00:00 2 via ORAL
  Filled 2019-05-02: qty 2

## 2019-05-02 MED ORDER — EPHEDRINE 5 MG/ML INJ: 10.0000 mg | INTRAVENOUS | Status: DC | PRN

## 2019-05-02 MED ORDER — SOD CITRATE-CITRIC ACID 500-334 MG/5ML PO SOLN: 30.0000 mL | ORAL | Status: DC | PRN

## 2019-05-02 MED ORDER — ONDANSETRON HCL 4 MG PO TABS: 4.0000 mg | ORAL_TABLET | ORAL | Status: DC | PRN

## 2019-05-02 MED ORDER — IBUPROFEN 600 MG PO TABS
600.0000 mg | ORAL_TABLET | Freq: Four times a day (QID) | ORAL | Status: DC
  Administered 2019-05-02 – 2019-05-03 (×4): 600 mg via ORAL
  Filled 2019-05-02 (×4): qty 1

## 2019-05-02 MED ORDER — FENTANYL 2.5 MCG/ML W/ROPIVACAINE 0.15% IN NS 100 ML EPIDURAL (ARMC)
EPIDURAL | Status: AC
  Filled 2019-05-02: qty 100

## 2019-05-02 MED ORDER — WITCH HAZEL-GLYCERIN EX PADS: 1.0000 "application " | MEDICATED_PAD | CUTANEOUS | Status: DC | PRN

## 2019-05-02 MED ORDER — SODIUM CHLORIDE 0.9% FLUSH
3.0000 mL | Freq: Two times a day (BID) | INTRAVENOUS | Status: DC
  Administered 2019-05-02: 22:00:00 3 mL via INTRAVENOUS

## 2019-05-02 MED ORDER — SODIUM CHLORIDE 0.9 % IV SOLN: 250.0000 mL | INTRAVENOUS | Status: DC | PRN

## 2019-05-02 MED ORDER — SODIUM CHLORIDE 0.9% FLUSH: 3.0000 mL | INTRAVENOUS | Status: DC | PRN

## 2019-05-02 MED ORDER — MISOPROSTOL 200 MCG PO TABS
ORAL_TABLET | ORAL | Status: AC
  Filled 2019-05-02: qty 4

## 2019-05-02 MED ORDER — ACETAMINOPHEN 325 MG PO TABS
650.0000 mg | ORAL_TABLET | ORAL | Status: DC | PRN
  Administered 2019-05-02 – 2019-05-03 (×3): 650 mg via ORAL
  Filled 2019-05-02 (×3): qty 2

## 2019-05-02 MED ORDER — DIPHENHYDRAMINE HCL 50 MG/ML IJ SOLN: 12.5000 mg | INTRAMUSCULAR | Status: DC | PRN

## 2019-05-02 MED ORDER — ACETAMINOPHEN 325 MG PO TABS: 650.0000 mg | ORAL_TABLET | ORAL | Status: DC | PRN

## 2019-05-02 MED ORDER — ONDANSETRON HCL 4 MG/2ML IJ SOLN: 4.0000 mg | Freq: Four times a day (QID) | INTRAMUSCULAR | Status: DC | PRN

## 2019-05-02 MED ORDER — FENTANYL CITRATE (PF) 100 MCG/2ML IJ SOLN
50.0000 ug | INTRAMUSCULAR | Status: DC | PRN
  Administered 2019-05-02: 05:00:00 100 ug via INTRAVENOUS

## 2019-05-02 MED ORDER — DIBUCAINE (PERIANAL) 1 % EX OINT: 1.0000 "application " | TOPICAL_OINTMENT | CUTANEOUS | Status: DC | PRN

## 2019-05-02 MED ORDER — FENTANYL 2.5 MCG/ML W/ROPIVACAINE 0.15% IN NS 100 ML EPIDURAL (ARMC): 12.0000 mL/h | EPIDURAL | Status: DC

## 2019-05-02 MED ORDER — PHENYLEPHRINE 40 MCG/ML (10ML) SYRINGE FOR IV PUSH (FOR BLOOD PRESSURE SUPPORT): 80.0000 ug | PREFILLED_SYRINGE | INTRAVENOUS | Status: DC | PRN

## 2019-05-02 MED ORDER — VARICELLA VIRUS VACCINE LIVE 1350 PFU/0.5ML IJ SUSR
0.5000 mL | Freq: Once | INTRAMUSCULAR | Status: AC
  Administered 2019-05-03: 0.5 mL via SUBCUTANEOUS
  Filled 2019-05-02 (×2): qty 0.5

## 2019-05-02 MED ORDER — DIPHENHYDRAMINE HCL 25 MG PO CAPS: 25.0000 mg | ORAL_CAPSULE | Freq: Four times a day (QID) | ORAL | Status: DC | PRN

## 2019-05-02 MED ORDER — LIDOCAINE HCL (PF) 1 % IJ SOLN
30.0000 mL | INTRAMUSCULAR | Status: AC | PRN
  Administered 2019-05-02: 1.2 mL via SUBCUTANEOUS
  Filled 2019-05-02: qty 30

## 2019-05-02 MED ORDER — OXYTOCIN BOLUS FROM INFUSION
500.0000 mL | Freq: Once | INTRAVENOUS | Status: DC
  Administered 2019-05-02: 10:00:00 500 mL via INTRAVENOUS

## 2019-05-02 MED ORDER — SIMETHICONE 80 MG PO CHEW: 80.0000 mg | CHEWABLE_TABLET | ORAL | Status: DC | PRN

## 2019-05-02 MED ORDER — LACTATED RINGERS IV SOLN: 500.0000 mL | INTRAVENOUS | Status: DC | PRN

## 2019-05-02 MED ORDER — LIDOCAINE-EPINEPHRINE (PF) 1.5 %-1:200000 IJ SOLN
INTRAMUSCULAR | Status: DC | PRN
  Administered 2019-05-02: 3 mL via EPIDURAL

## 2019-05-02 MED ORDER — OXYTOCIN 10 UNIT/ML IJ SOLN
INTRAMUSCULAR | Status: AC
  Filled 2019-05-02: qty 2

## 2019-05-02 MED ORDER — METHYLERGONOVINE MALEATE 0.2 MG/ML IJ SOLN: 0.2000 mg | INTRAMUSCULAR | Status: DC | PRN

## 2019-05-02 MED ORDER — OXYTOCIN 40 UNITS IN NORMAL SALINE INFUSION - SIMPLE MED
2.5000 [IU]/h | INTRAVENOUS | Status: DC
  Filled 2019-05-02: qty 1000

## 2019-05-02 MED ORDER — LACTATED RINGERS IV SOLN: 500.0000 mL | Freq: Once | INTRAVENOUS | Status: DC

## 2019-05-02 MED ORDER — COCONUT OIL OIL: 1.0000 "application " | TOPICAL_OIL | Status: DC | PRN

## 2019-05-02 MED ORDER — LACTATED RINGERS IV SOLN: INTRAVENOUS | Status: DC

## 2019-05-02 MED ORDER — METHYLERGONOVINE MALEATE 0.2 MG PO TABS: 0.2000 mg | ORAL_TABLET | ORAL | Status: DC | PRN

## 2019-05-02 MED ORDER — BENZOCAINE-MENTHOL 20-0.5 % EX AERO: 1.0000 "application " | INHALATION_SPRAY | CUTANEOUS | Status: DC | PRN

## 2019-05-02 MED ORDER — FENTANYL CITRATE (PF) 100 MCG/2ML IJ SOLN
INTRAMUSCULAR | Status: AC
  Filled 2019-05-02: qty 2

## 2019-05-02 MED ORDER — AMMONIA AROMATIC IN INHA
RESPIRATORY_TRACT | Status: AC
  Filled 2019-05-02: qty 10

## 2019-05-02 MED ORDER — FENTANYL 2.5 MCG/ML W/ROPIVACAINE 0.15% IN NS 100 ML EPIDURAL (ARMC)
EPIDURAL | Status: DC | PRN
  Administered 2019-05-02: 12 mL/h via EPIDURAL

## 2019-05-02 MED ORDER — ONDANSETRON HCL 4 MG/2ML IJ SOLN
4.0000 mg | INTRAMUSCULAR | Status: DC | PRN
  Administered 2019-05-02: 4 mg via INTRAVENOUS
  Filled 2019-05-02: qty 2

## 2019-05-02 NOTE — Anesthesia Preprocedure Evaluation (Signed)
Anesthesia Evaluation  Patient identified by MRN, date of birth, ID band Patient awake    Reviewed: Allergy & Precautions, NPO status , Patient's Chart, lab work & pertinent test results  History of Anesthesia Complications Negative for: history of anesthetic complications  Airway Mallampati: II       Dental   Pulmonary neg sleep apnea, neg COPD, Current Smoker,           Cardiovascular (-) hypertension(-) Past MI and (-) CHF (-) dysrhythmias (-) Valvular Problems/Murmurs     Neuro/Psych Seizures - (none in more than 2 years, no meds),  Anxiety    GI/Hepatic Neg liver ROS, neg GERD  ,  Endo/Other  neg diabetes  Renal/GU negative Renal ROS     Musculoskeletal   Abdominal   Peds  Hematology   Anesthesia Other Findings   Reproductive/Obstetrics (+) Pregnancy                             Anesthesia Physical Anesthesia Plan  ASA: II  Anesthesia Plan: Epidural   Post-op Pain Management:    Induction:   PONV Risk Score and Plan:   Airway Management Planned:   Additional Equipment:   Intra-op Plan:   Post-operative Plan:   Informed Consent: I have reviewed the patients History and Physical, chart, labs and discussed the procedure including the risks, benefits and alternatives for the proposed anesthesia with the patient or authorized representative who has indicated his/her understanding and acceptance.       Plan Discussed with:   Anesthesia Plan Comments:         Anesthesia Quick Evaluation

## 2019-05-02 NOTE — Anesthesia Procedure Notes (Signed)
Epidural Patient location during procedure: OB Start time: 05/02/2019 6:06 AM End time: 05/02/2019 6:29 AM  Staffing Performed: anesthesiologist   Preanesthetic Checklist Completed: patient identified, IV checked, site marked, risks and benefits discussed, surgical consent, monitors and equipment checked, pre-op evaluation and timeout performed  Epidural Patient position: sitting Prep: Betadine Patient monitoring: heart rate, continuous pulse ox and blood pressure Approach: midline Location: L4-L5 Injection technique: LOR saline  Needle:  Needle type: Tuohy  Needle gauge: 18 G Needle length: 9 cm and 9 Needle insertion depth: 8 cm Catheter type: closed end flexible Catheter size: 20 Guage Catheter at skin depth: 13 cm Test dose: negative and 1.5% lidocaine with Epi 1:200 K  Assessment Events: blood not aspirated, injection not painful, no injection resistance, no paresthesia and negative IV test  Additional Notes   Patient tolerated the insertion well without complications.Reason for block:procedure for pain

## 2019-05-02 NOTE — OB Triage Note (Signed)
Pt presents to unit c/o ctx 4 min apart that began around an hour ago. Pt reports +FM, denies vaginal bleeding and LOF. Initial FHT 130s. Vital signs WDL. Will continue to monitor.

## 2019-05-02 NOTE — H&P (Signed)
Obstetric History and Physical  Tamara Barron is a 24 y.o. G2P1001 with IUP at [redacted]w[redacted]d presenting with regular painful contractions since 0300.   Patient states she has been having  regular, every five (5) minutes contractions, none vaginal bleeding, intact membranes, with active fetal movement.    Denies difficulty breathing or respiratory distress, chest pain, dysuria, and leg pain or swelling.   Prenatal Course  Source of Care: [facility]-initial visit at 9 weeks, total visits: 14  Pregnancy complications or risks: History seizures-no medications, History of PID, History of alcohol use, Vape use, Anemia in pregnancy  Prenatal labs and studies:  ABO, Rh: --/--/A POS Performed at [facility], [address] [place], [place] [address]  [phone]/23 0601)  Antibody: NEG (02/23 0510)  Rubella: 1.74 (07/24 1031)  RPR: Non Reactive (11/25 1112)   Varicella: <135 (07/24 1031)  HBsAg: Negative (07/24 1031)   HIV: Results pending  GBS:--/Negative (01/29 1559)  1 hr Glucola: 89 (11/25 1112)  Genetic screening: Declined  Anatomy US: Complete, normal (10/14 1121)  Past Medical History:  Diagnosis Date  . Epilepsy (HCC)    Last seizure 6 months ago. Self-discontinued Keppra due to cost.  . History of chlamydia    treated  . History of pelvic inflammatory disease    treated  . History of urinary tract infection   . PTSD (post-traumatic stress disorder)   . Seizures (HCC)    epilepsy    Past Surgical History:  Procedure Laterality Date  . KNEE SURGERY      OB History  Gravida Para Term Preterm AB Living  2 1 1     1   SAB TAB Ectopic Multiple Live Births          1    # Outcome Date GA Lbr Len/2nd Weight Sex Delivery Anes PTL Lv  2 Current           1 Term 12/05/14 [redacted]w[redacted]d  2920 g  Vag-Spont  N LIV    Social History   Socioeconomic History  . Marital status: Significant Other    Spouse name: Not on file  . Number of children: Not on file  . Years of  education: Not on file  . Highest education level: Not on file  Occupational History  . Not on file  Tobacco Use  . Smoking status: Current Every Day Smoker    Packs/day: 0.25    Years: 7.00    Pack years: 1.75    Types: E-cigarettes  . Smokeless tobacco: Never Used  Substance and Sexual Activity  . Alcohol use: Not Currently    Comment: daily--"a lot"-a 5th probably  . Drug use: Not Currently    Types: Marijuana  . Sexual activity: Yes    Birth control/protection: Pill  Other Topics Concern  . Not on file  Social History Narrative  . Not on file   Social Determinants of Health   Financial Resource Strain:   . Difficulty of Paying Living Expenses: Not on file  Food Insecurity:   . Worried About [redacted]w[redacted]d in the Last Year: Not on file  . Ran Out of Food in the Last Year: Not on file  Transportation Needs:   . Lack of Transportation (Medical): Not on file  . Lack of Transportation (Non-Medical): Not on file  Physical Activity:   . Days of Exercise per Week: Not on file  . Minutes of Exercise per Session: Not on file  Stress:   . Feeling of  Stress : Not on file  Social Connections:   . Frequency of Communication with Friends and Family: Not on file  . Frequency of Social Gatherings with Friends and Family: Not on file  . Attends Religious Services: Not on file  . Active Member of Clubs or Organizations: Not on file  . Attends Archivist Meetings: Not on file  . Marital Status: Not on file    History reviewed. No pertinent family history.  Medications Prior to Admission  Medication Sig Dispense Refill Last Dose  . ferrous sulfate 325 (65 FE) MG tablet Take 325 mg by mouth daily with breakfast.   05/01/2019 at Unknown time  . Prenatal Vit-Fe Fumarate-FA (MULTIVITAMIN-PRENATAL) 27-0.8 MG TABS tablet Take 1 tablet by mouth daily at 12 noon.   05/01/2019 at Unknown time    Allergies  Allergen Reactions  . Oxycodone Hcl Rash    Review of Systems:  Negative except for what is mentioned in HPI.  Physical Exam:  Temp:  [97.4 F (36.3 C)-97.9 F (36.6 C)] 97.9 F (36.6 C) (02/23 0708) Pulse Rate:  [57-84] 59 (02/23 0807) Resp:  [15-20] 15 (02/23 0708) BP: (95-124)/(51-88) 113/80 (02/23 0807) SpO2:  [98 %-100 %] 100 % (02/23 0831) Weight:  [77.6 kg] 77.6 kg (02/23 0456)  GENERAL: Well-developed, well-nourished female in no acute distress.   LUNGS: Clear to auscultation bilaterally.   HEART: Regular rate and rhythm.  ABDOMEN: Soft, nontender, nondistended, gravid.  EXTREMITIES: Nontender, no edema, 2+ distal pulses.  Cervical Exam: Dilation: 6 Effacement (%): 90 Cervical Position: Anterior Station: -2 Presentation: Vertex Exam by:: Julyan Gales, CNM  FHR: Category I  Contractions: Every two (2) to five (5) minutes, soft resting tone  AROM: moderate amount, meconium stained fluid   Pertinent Labs/Studies:   Results for orders placed or performed during the hospital encounter of 05/02/19 (from the past 24 hour(s))  Respiratory Panel by RT PCR (Flu A&B, Covid) - Nasopharyngeal Swab     Status: None   Collection Time: 05/02/19  5:10 AM   Specimen: Nasopharyngeal Swab  Result Value Ref Range   SARS Coronavirus 2 by RT PCR NEGATIVE NEGATIVE   Influenza A by PCR NEGATIVE NEGATIVE   Influenza B by PCR NEGATIVE NEGATIVE  CBC     Status: Abnormal   Collection Time: 05/02/19  5:10 AM  Result Value Ref Range   WBC 18.5 (H) 4.0 - 10.5 K/uL   RBC 3.41 (L) 3.87 - 5.11 MIL/uL   Hemoglobin 10.6 (L) 12.0 - 15.0 g/dL   HCT 31.1 (L) 36.0 - 46.0 %   MCV 91.2 80.0 - 100.0 fL   MCH 31.1 26.0 - 34.0 pg   MCHC 34.1 30.0 - 36.0 g/dL   RDW 13.2 11.5 - 15.5 %   Platelets 253 150 - 400 K/uL   nRBC 0.0 0.0 - 0.2 %  Type and screen [place]     Status: None   Collection Time: 05/02/19  5:10 AM  Result Value Ref Range   ABO/RH(D) A POS    Antibody Screen NEG    Sample Expiration      05/05/2019,2359 Performed at  [facility], [address], [place], [place] [address]   ABO/Rh     Status: None   Collection Time: 05/02/19  6:01 AM  Result Value Ref Range   ABO/RH(D)      A POS Performed at [facility], [address], [place], [place] [address]     Assessment :  Tamara Barron is a 24 y.o. G2P1001 at [redacted]w[redacted]d being admitted for labor, Rh positive, GBS negative, History seizures-no medications, History of PID, History of alcohol use, Vape use, Anemia in pregnancy  FHR Category I  Plan:  Admit to birthing suites, see orders  Labor: Expectant management.  Induction/Augmentation as needed, per protocol  Plan: Hopeful for vaginal birth  Dr. Valentino Saxon notified of admission and plan of care   Gunnar Bulla, CNM [facility], [facility] 05/02/19 8:35 AM

## 2019-05-03 ENCOUNTER — Telehealth: Payer: Self-pay | Admitting: Certified Nurse Midwife

## 2019-05-03 LAB — CBC
HCT: 29.3 % — ABNORMAL LOW (ref 36.0–46.0)
Hemoglobin: 9.7 g/dL — ABNORMAL LOW (ref 12.0–15.0)
MCH: 30.4 pg (ref 26.0–34.0)
MCHC: 33.1 g/dL (ref 30.0–36.0)
MCV: 91.8 fL (ref 80.0–100.0)
Platelets: 212 10*3/uL (ref 150–400)
RBC: 3.19 MIL/uL — ABNORMAL LOW (ref 3.87–5.11)
RDW: 13.3 % (ref 11.5–15.5)
WBC: 14.6 10*3/uL — ABNORMAL HIGH (ref 4.0–10.5)
nRBC: 0 % (ref 0.0–0.2)

## 2019-05-03 MED ORDER — IBUPROFEN 600 MG PO TABS: 600.0000 mg | ORAL_TABLET | Freq: Four times a day (QID) | ORAL | 0 refills | Status: DC

## 2019-05-03 MED ORDER — NORETHINDRONE 0.35 MG PO TABS: 1.0000 | ORAL_TABLET | Freq: Every day | ORAL | 11 refills | Status: DC

## 2019-05-03 NOTE — Final Progress Note (Signed)
Discharge Day SOAP Note:  Progress Note - Vaginal Delivery  Tamara Barron is a 24 y.o. G2P2002 now PP day 1 s/p Vaginal, Spontaneous . Delivery was uncomplicated  Subjective  The patient has the following complaints: has no unusual complaints  Pain is controlled with current medications.   Patient is urinating without difficulty.  She is ambulating well.     Objective  Vital signs: BP 122/84 (BP Location: Right Arm)   Pulse 66   Temp 97.7 F (36.5 C) (Oral)   Resp 18   Ht 5\' 7"  (1.702 m)   Wt 77.6 kg   LMP 07/23/2018 (Approximate)   SpO2 100%   Breastfeeding Unknown   BMI 26.78 kg/m   Physical Exam: Gen: NAD Fundus Fundal Tone: Firm  Lochia Amount: Small  Perineum Appearance: Intact     Data Review Labs: CBC Latest Ref Rng & Units 05/03/2019 05/02/2019 02/01/2019  WBC 4.0 - 10.5 K/uL 14.6(H) 18.5(H) 12.2(H)  Hemoglobin 12.0 - 15.0 g/dL 02/03/2019) 10.6(L) 10.3(L)  Hematocrit 36.0 - 46.0 % 29.3(L) 31.1(L) 30.8(L)  Platelets 150 - 400 K/uL 212 253 295   A POS Performed at [facility], [address] [place], [place]   Assessment/Plan  Active Problems:   Normal labor    Plan for discharge today.   Discharge Instructions: Per After Visit Summary. Activity: Advance as tolerated. Pelvic rest for 6 weeks.  Also refer to After Visit Summary Diet: Regular Medications: Allergies as of 05/03/2019      Reactions   Oxycodone Hcl Rash      Medication List    TAKE these medications   ferrous sulfate 325 (65 FE) MG tablet Take 325 mg by mouth daily with breakfast.   ibuprofen 600 MG tablet Commonly known as: ADVIL Take 1 tablet (600 mg total) by mouth every 6 (six) hours.   multivitamin-prenatal 27-0.8 MG Tabs tablet Take 1 tablet by mouth daily at 12 noon.   norethindrone 0.35 MG tablet Commonly known as: MICRONOR Take 1 tablet (0.35 mg total) by mouth daily. Start 4 wks postpartum      Outpatient follow up: 05/05/2019, CNM @  6 wks  Postpartum contraception: progestin only pill start at 4 wks   Discharged Condition: good  Discharged to: home  Newborn Data: Disposition:home with mother  Apgars: APGAR (1 MIN): 9   APGAR (5 MINS): 9   APGAR (10 MINS):    Baby Feeding: Breast    [name], CNM  05/03/2019 8:30 AM

## 2019-05-03 NOTE — Telephone Encounter (Signed)
lmtrc for 2 week televist, for ppv and 6 week ppv too

## 2019-05-03 NOTE — Lactation Note (Signed)
This note was copied from a baby's chart. Lactation Consultation Note  Patient Name: Tamara Barron GYBWL'S Date: 05/03/2019 Reason for consult: Follow-up assessment  LC in to see mom and baby. This is mom's second baby, and second time breastfeeding. She feels baby has been doing well, some cluster feeding overnight. No pain/discomfort noted. Mom desires breast pump to continue with breastfeeding post discharge- has WIC, and pump options reviewed with her; mom plans to seek pump through [facility] instead of renting. Reviewed with mom breastfeeding expectations in the days and weeks to come, newborn feeding behaviors, cluster feeding, growth spurts, void/stools, benefits of skin to skin, and identifying early hunger cues. Mom feels that formula may be introduced soon due to life demands- LC provided education on the impact that formula may have on her establishment of adequate milk supply, and baby's tummy during the transition. Encouraged to follow-up with WIC to be able to provide expressed breastmilk. Outpatient lactation resources and information given. Mom had no questions/concerns. Encouraged to call out today if needed before discharge.  Maternal Data Formula Feeding for Exclusion: No Has patient been taught Hand Expression?: Yes Does the patient have breastfeeding experience prior to this delivery?: Yes  Feeding Feeding Type: Breast Fed  LATCH Score                   Interventions Interventions: Breast feeding basics reviewed  Lactation Tools Discussed/Used WIC Program: Yes   Consult Status Consult Status: Complete    Danford Bad 05/03/2019, 10:17 AM

## 2019-05-03 NOTE — Progress Notes (Signed)
RNCM assessed patient at bedside due to consult placed for Edinburgh screening score. Patient initially up to bathroom but ambulated back to bed without difficulty. FOB- Catarino is at bedside and baby Denna Haggard is lying in mother's bed with no distress noted. Mother reports that they are planning to take baby to [facility], they have a 26 year old at home and that is where she goes. Patient reports that she does not drive but FOB does and is able to transport them to appointments. She further reports that they have all necessary equipment for the baby at home and does not express any further needs. Patient reports that she does get [facility] and is working on getting signed up for FS. Patient verbalizing questions about obtaining a breast pump. Discussed depression and patient reports that this is something she has struggle with in the past. She reports she is now in a better place than she was and that now she has better support. Patient does however verbalize that she understands how and when to ask for help if she needs it. No other needs identified at this time.

## 2019-05-03 NOTE — Discharge Summary (Signed)
                              Discharge Summary  Date of Admission: 05/02/2019  Date of Discharge: 05/03/2019  Admitting Diagnosis: Onset of Labor at [redacted]w[redacted]d  Mode of Delivery: normal spontaneous vaginal delivery                 Discharge Diagnosis: No other diagnosis   Intrapartum Procedures: epidural   Post partum procedures: none  Complications:  Vaginal skinmarks hemostatic, no repair needed                      Discharge Day SOAP Note:  Progress Note - Vaginal Delivery  Tamara Barron is a 24 y.o. G2P2002 now PP day 1 s/p Vaginal, Spontaneous . Delivery was uncomplicated  Subjective  The patient has the following complaints: has no unusual complaints  Pain is controlled with current medications.   Patient is urinating without difficulty.  She is ambulating well.     Objective  Vital signs: BP 122/84 (BP Location: Right Arm)   Pulse 66   Temp 97.7 F (36.5 C) (Oral)   Resp 18   Ht 5\' 7"  (1.702 m)   Wt 77.6 kg   LMP 07/23/2018 (Approximate)   SpO2 100%   Breastfeeding Unknown   BMI 26.78 kg/m   Physical Exam: Gen: NAD Fundus Fundal Tone: Firm  Lochia Amount: Small  Perineum Appearance: Intact     Data Review Labs: CBC Latest Ref Rng & Units 05/03/2019 05/02/2019 02/01/2019  WBC 4.0 - 10.5 K/uL 14.6(H) 18.5(H) 12.2(H)  Hemoglobin 12.0 - 15.0 g/dL 02/03/2019) 10.6(L) 10.3(L)  Hematocrit 36.0 - 46.0 % 29.3(L) 31.1(L) 30.8(L)  Platelets 150 - 400 K/uL 212 253 295   A POS Performed at [facility], [address] [place], [place]   Assessment/Plan  Active Problems:   Normal labor    Plan for discharge today.   Discharge Instructions: Per After Visit Summary. Activity: Advance as tolerated. Pelvic rest for 6 weeks.  Also refer to After Visit Summary Diet: Regular Medications: Allergies as of 05/03/2019      Reactions   Oxycodone Hcl Rash      Medication List    TAKE these medications   ferrous sulfate 325 (65 FE) MG  tablet Take 325 mg by mouth daily with breakfast.   ibuprofen 600 MG tablet Commonly known as: ADVIL Take 1 tablet (600 mg total) by mouth every 6 (six) hours.   multivitamin-prenatal 27-0.8 MG Tabs tablet Take 1 tablet by mouth daily at 12 noon.   norethindrone 0.35 MG tablet Commonly known as: MICRONOR Take 1 tablet (0.35 mg total) by mouth daily. Start 4 wks postpartum      Outpatient follow up: 05/05/2019, CNM @ 6 wks  Postpartum contraception: progestin only pill start at 4 wks   Discharged Condition: good  Discharged to: home  Newborn Data: Disposition:home with mother  Apgars: APGAR (1 MIN): 9   APGAR (5 MINS): 9   APGAR (10 MINS):    Baby Feeding: Breast    Tamara Barron, CNM  05/03/2019 8:30 AM

## 2019-05-03 NOTE — Progress Notes (Signed)
Pt discharged with infant.  Discharge instructions, prescriptions and follow up appointment given to and reviewed with pt. Pt verbalized understanding. Escorted out by auxillary. 

## 2019-05-03 NOTE — Discharge Instructions (Signed)
Postpartum Baby Blues The postpartum period begins right after the birth of a baby. During this time, there is often a lot of joy and excitement. It is also a time of many changes in the life of the parents. No matter how many times a mother gives birth, each child brings new challenges to the family, including different ways of relating to one another. It is common to have feelings of excitement along with confusing changes in moods, emotions, and thoughts. You may feel happy one minute and sad or stressed the next. These feelings of sadness usually happen in the period right after you have your baby, and they go away within a week or two. This is called the "baby blues." What are the causes? There is no known cause of baby blues. It is likely caused by a combination of factors. However, changes in hormone levels after childbirth are believed to trigger some of the symptoms. Other factors that can play a role in these mood changes include:  Lack of sleep.  Stressful life events, such as poverty, caring for a loved one, or death of a loved one.  Genetics. What are the signs or symptoms? Symptoms of this condition include:  Brief changes in mood, such as going from extreme happiness to sadness.  Decreased concentration.  Difficulty sleeping.  Crying spells and tearfulness.  Loss of appetite.  Irritability.  Anxiety. If the symptoms of baby blues last for more than 2 weeks or become more severe, you may have postpartum depression. How is this diagnosed? This condition is diagnosed based on an evaluation of your symptoms. There are no medical or lab tests that lead to a diagnosis, but there are various questionnaires that a health care provider may use to identify women with the baby blues or postpartum depression. How is this treated? Treatment is not needed for this condition. The baby blues usually go away on their own in 1-2 weeks. Social support is often all that is needed. You will  be encouraged to get adequate sleep and rest. Follow these instructions at home: Lifestyle      Get as much rest as you can. Take a nap when the baby sleeps.  Exercise regularly as told by your health care provider. Some women find yoga and walking to be helpful.  Eat a balanced and nourishing diet. This includes plenty of fruits and vegetables, whole grains, and lean proteins.  Do little things that you enjoy. Have a cup of tea, take a bubble bath, read your favorite magazine, or listen to your favorite music.  Avoid alcohol.  Ask for help with household chores, cooking, grocery shopping, or running errands. Do not try to do everything yourself. Consider hiring a postpartum doula to help. This is a professional who specializes in providing support to new mothers.  Try not to make any major life changes during pregnancy or right after giving birth. This can add stress. General instructions  Talk to people close to you about how you are feeling. Get support from your partner, family members, friends, or other new moms. You may want to join a support group.  Find ways to cope with stress. This may include: ? Writing your thoughts and feelings in a journal. ? Spending time outside. ? Spending time with people who make you laugh.  Try to stay positive in how you think. Think about the things you are grateful for.  Take over-the-counter and prescription medicines only as told by your health care provider.    Let your health care provider know if you have any concerns.  Keep all postpartum visits as told by your health care provider. This is important. Contact a health care provider if:  Your baby blues do not go away after 2 weeks. Get help right away if:  You have thoughts of taking your own life (suicidal thoughts).  You think you may harm the baby or other people.  You see or hear things that are not there (hallucinations). Summary  After giving birth, you may feel happy  one minute and sad or stressed the next. Feelings of sadness that happen right after the baby is born and go away after a week or two are called the "baby blues."  You can manage the baby blues by getting enough rest, eating a healthy diet, exercising, spending time with supportive people, and finding ways to cope with stress.  If feelings of sadness and stress last longer than 2 weeks or get in the way of caring for your baby, talk to your health care provider. This may mean you have postpartum depression. This information is not intended to replace advice given to you by your health care provider. Make sure you discuss any questions you have with your health care provider. Document Revised: 06/17/2018 Document Reviewed: 04/21/2016 Elsevier Patient Education  2020 [place]. Postpartum Care After Cesarean Delivery This sheet gives you information about how to care for yourself from the time you deliver your baby to up to 6-12 weeks after delivery (postpartum period). Your health care provider may also give you more specific instructions. If you have problems or questions, contact your health care provider. Follow these instructions at home: Medicines  Take over-the-counter and prescription medicines only as told by your health care provider.  If you were prescribed an antibiotic medicine, take it as told by your health care provider. Do not stop taking the antibiotic even if you start to feel better.  Ask your health care provider if the medicine prescribed to you: ? Requires you to avoid driving or using heavy machinery. ? Can cause constipation. You may need to take actions to prevent or treat constipation, such as:  Drink enough fluid to keep your urine pale yellow.  Take over-the-counter or prescription medicines.  Eat foods that are high in fiber, such as beans, whole grains, and fresh fruits and vegetables.  Limit foods that are high in fat and processed sugars, such as fried or  sweet foods. Activity  Gradually return to your normal activities as told by your health care provider.  Avoid activities that take a lot of effort and energy (are strenuous) until approved by your health care provider. Walking at a slow to moderate pace is usually safe. Ask your health care provider what activities are safe for you. ? Do not lift anything that is heavier than your baby or 10 lb (4.5 kg) as told by your health care provider. ? Do not vacuum, climb stairs, or drive a car for as long as told by your health care provider.  If possible, have someone help you at home until you are able to do your usual activities yourself.  Rest as much as possible. Try to rest or take naps while your baby is sleeping. Vaginal bleeding  It is normal to have vaginal bleeding (lochia) after delivery. Wear a sanitary pad to absorb vaginal bleeding and discharge. ? During the first week after delivery, the amount and appearance of lochia is often similar to a menstrual  period. ? Over the next few weeks, it will gradually decrease to a dry, yellow-brown discharge. ? For most women, lochia stops completely by 4-6 weeks after delivery. Vaginal bleeding can vary from woman to woman.  Change your sanitary pads frequently. Watch for any changes in your flow, such as: ? A sudden increase in volume. ? A change in color. ? Large blood clots.  If you pass a blood clot, save it and call your health care provider to discuss. Do not flush blood clots down the toilet before you get instructions from your health care provider.  Do not use tampons or douches until your health care provider says this is safe.  If you are not breastfeeding, your period should return 6-8 weeks after delivery. If you are breastfeeding, your period may return anytime between 8 weeks after delivery and the time that you stop breastfeeding. Perineal care   If your C-section (Cesarean section) was unplanned, and you were allowed to  labor and push before delivery, you may have pain, swelling, and discomfort of the tissue between your vaginal opening and your anus (perineum). You may also have an incision in the tissue (episiotomy) or the tissue may have torn during delivery. Follow these instructions as told by your health care provider: ? Keep your perineum clean and dry as told by your health care provider. Use medicated pads and pain-relieving sprays and creams as directed. ? If you have an episiotomy or vaginal tear, check the area every day for signs of infection. Check for:  Redness, swelling, or pain.  Fluid or blood.  Warmth.  Pus or a bad smell. ? You may be given a squirt bottle to use instead of wiping to clean the perineum area after you go to the bathroom. As you start healing, you may use the squirt bottle before wiping yourself. Make sure to wipe gently. ? To relieve pain caused by an episiotomy, vaginal tear, or hemorrhoids, try taking a warm sitz bath 2-3 times a day. A sitz bath is a warm water bath that is taken while you are sitting down. The water should only come up to your hips and should cover your buttocks. Breast care  Within the first few days after delivery, your breasts may feel heavy, full, and uncomfortable (breast engorgement). You may also have milk leaking from your breasts. Your health care provider can suggest ways to help relieve breast discomfort. Breast engorgement should go away within a few days.  If you are breastfeeding: ? Wear a bra that supports your breasts and fits you well. ? Keep your nipples clean and dry. Apply creams and ointments as told by your health care provider. ? You may need to use breast pads to absorb milk leakage. ? You may have uterine contractions every time you breastfeed for several weeks after delivery. Uterine contractions help your uterus return to its normal size. ? If you have any problems with breastfeeding, work with your health care provider or a  Science writer.  If you are not breastfeeding: ? Avoid touching your breasts as this can make your breasts produce more milk. ? Wear a well-fitting bra and use cold packs to help with swelling. ? Do not squeeze out (express) milk. This causes you to make more milk. Intimacy and sexuality  Ask your health care provider when you can engage in sexual activity. This may depend on your: ? Risk of infection. ? Healing rate. ? Comfort and desire to engage in sexual activity.  You are able to get pregnant after delivery, even if you have not had your period. If desired, talk with your health care provider about methods of family planning or birth control (contraception). Lifestyle  Do not use any products that contain nicotine or tobacco, such as cigarettes, e-cigarettes, and chewing tobacco. If you need help quitting, ask your health care provider.  Do not drink alcohol, especially if you are breastfeeding. Eating and drinking   Drink enough fluid to keep your urine pale yellow.  Eat high-fiber foods every day. These may help prevent or relieve constipation. High-fiber foods include: ? Whole grain cereals and breads. ? Brown rice. ? Beans. ? Fresh fruits and vegetables.  Take your prenatal vitamins until your postpartum checkup or until your health care provider tells you it is okay to stop. General instructions  Keep all follow-up visits for you and your baby as told by your health care provider. Most women visit their health care provider for a postpartum checkup within the first 3-6 weeks after delivery. Contact a health care provider if you:  Feel unable to cope with the changes that a new baby brings to your life, and these feelings do not go away.  Feel unusually sad or worried.  Have breasts that are painful, hard, or turn red.  Have a fever.  Have trouble holding urine or keeping urine from leaking.  Have little or no interest in activities you used to  enjoy.  Have not breastfed at all and you have not had a menstrual period for 12 weeks after delivery.  Have stopped breastfeeding and you have not had a menstrual period for 12 weeks after you stopped breastfeeding.  Have questions about caring for yourself or your baby.  Pass a blood clot from your vagina. Get help right away if you:  Have chest pain.  Have difficulty breathing.  Have sudden, severe leg pain.  Have severe pain or cramping in your abdomen.  Bleed from your vagina so much that you fill more than one sanitary pad in one hour. Bleeding should not be heavier than your heaviest period.  Develop a severe headache.  Faint.  Have blurred vision or spots in your vision.  Have a bad-smelling vaginal discharge.  Have thoughts about hurting yourself or your baby. If you ever feel like you may hurt yourself or others, or have thoughts about taking your own life, get help right away. You can go to your nearest emergency department or call:  Your local emergency services (911 in the [place]  A suicide crisis helpline, such as the National Suicide Prevention Lifeline at 807-336-5208. This is open 24 hours a day. Summary  The period of time from when you deliver your baby to up to 6-12 weeks after delivery is called the postpartum period.  Gradually return to your normal activities as told by your health care provider.  Keep all follow-up visits for you and your baby as told by your health care provider. This information is not intended to replace advice given to you by your health care provider. Make sure you discuss any questions you have with your health care provider. Document Revised: 10/13/2017 Document Reviewed: 10/13/2017 Elsevier Patient Education  2020 [employer].

## 2019-05-03 NOTE — Anesthesia Postprocedure Evaluation (Signed)
Anesthesia Post Note  Patient: Nutritional therapist  Procedure(s) Performed: AN AD HOC LABOR EPIDURAL  Patient location during evaluation: Mother Baby Anesthesia Type: Epidural Level of consciousness: awake and alert Pain management: pain level controlled Vital Signs Assessment: post-procedure vital signs reviewed and stable Respiratory status: spontaneous breathing, nonlabored ventilation and respiratory function stable Cardiovascular status: stable Postop Assessment: no headache, no backache, epidural receding and able to ambulate Anesthetic complications: no     Last Vitals:  Vitals:   05/02/19 1917 05/02/19 2307  BP: 111/67 (!) 99/58  Pulse: 62 (!) 55  Resp: 18 18  Temp: 36.8 C 36.7 C  SpO2: 98% 99%    Last Pain:  Vitals:   05/03/19 0610  TempSrc:   PainSc: Tamara Barron

## 2019-05-10 ENCOUNTER — Other Ambulatory Visit: Payer: Self-pay

## 2019-05-10 ENCOUNTER — Encounter: Payer: Self-pay | Admitting: Certified Nurse Midwife

## 2019-05-10 ENCOUNTER — Ambulatory Visit (INDEPENDENT_AMBULATORY_CARE_PROVIDER_SITE_OTHER): Payer: Medicaid Other | Admitting: Certified Nurse Midwife

## 2019-05-10 NOTE — Progress Notes (Signed)
Received a transferred call from [place] for a televisit.2 identifiers used. Patient states she has a good support system and she is doing well. Denies bleeding. C/O backache but overall doing well. PhQ9 0; GAD 1. Call transferred to Doreene Burke CNM for visit.

## 2019-05-10 NOTE — Progress Notes (Signed)
Virtual Visit via Telephone Note  I connected with Tamara Barron on 05/10/19 at 11:00 AM EST by telephone and verified that I am speaking with the correct person using two identifiers.   I discussed the limitations, risks, security and privacy concerns of performing an evaluation and management service by telephone and the availability of in person appointments. I also discussed with the patient that there may be a patient responsible charge related to this service. The patient expressed understanding and agreed to proceed.  Present on phone call: Patient @ home Tamara Barron in office [facility] RN, [place]  History of Present Illness: 24 yr old G2P2  2 weeks postpartum    Observations/Objective: Doing well. Bleeding is light period no clots, no pain. She is nursing and bottle feeding. No signs or symptoms of mastitis. Mood PHq90, Gad 1. State she is doing really well. Better than last time. Having no problems with going to bathroom.  Assessment and Plan: Normal postpartume. Start POP at 4 wks post partum. Follow Up Instructions: Follow up as scheduled for 6 wk visit.    I discussed the assessment and treatment plan with the patient. The patient was provided an opportunity to ask questions and all were answered. The patient agreed with the plan and demonstrated an understanding of the instructions.   The patient was advised to call back or seek an in-person evaluation if the symptoms worsen or if the condition fails to improve as anticipated.  I provided 10 minutes of non-face-to-face time during this encounter.   Doreene Burke, CNM

## 2019-06-13 ENCOUNTER — Encounter: Payer: Medicaid Other | Admitting: Certified Nurse Midwife

## 2019-06-20 ENCOUNTER — Encounter: Payer: Medicaid Other | Admitting: Certified Nurse Midwife

## 2019-09-07 DIAGNOSIS — Z419 Encounter for procedure for purposes other than remedying health state, unspecified: Secondary | ICD-10-CM | POA: Diagnosis not present

## 2019-09-08 ENCOUNTER — Encounter: Payer: Self-pay | Admitting: Emergency Medicine

## 2019-09-08 ENCOUNTER — Ambulatory Visit
Admission: EM | Admit: 2019-09-08 | Discharge: 2019-09-08 | Disposition: A | Discharge status: Home / Self Care | Payer: Medicaid Other

## 2019-09-08 ENCOUNTER — Other Ambulatory Visit: Payer: Self-pay

## 2019-09-08 DIAGNOSIS — J069 Acute upper respiratory infection, unspecified: Secondary | ICD-10-CM | POA: Diagnosis not present

## 2019-09-08 NOTE — ED Triage Notes (Addendum)
Patient in today c/o cough and congestion x 3-4 days. Patient states she called out of work on Tuesday due to symptoms and doesn't think she will be able to go to work tonight and needs a note for work. Patient has not had the covid vaccines. Patient denies fever. Patient has taken OTC mucous relief and Nyquil. Patient refuses a covid test.

## 2019-09-08 NOTE — Discharge Instructions (Addendum)
Recommend continue OTC mucus/cold medication and Nyquil as needed for symptom relief. Continue to push fluids to help loosen up any mucus in chest. Follow-up in 3 to 4 days if not resolving.

## 2019-09-09 NOTE — ED Provider Notes (Signed)
[facility]    CSN: [id number] Arrival date & time: 09/08/19  1213      History   Chief Complaint Chief Complaint  Patient presents with   Cough   Nasal Congestion    HPI Tamara Barron is a 24 y.o. female.   24 year old female presents with nasal congestion, cough and irritated throat for the past 3 to 4 days. Denies any fever or GI symptoms. Son also had similar symptoms. She is now feeling better and has taken OTC  Mucus relief and Nyquil with some relief. However, requests a note for today for work. Declines COVID 19 test. Has not received the COVID 19 vaccine. No other chronic health issues. Takes no daily medication.   The history is provided by the patient.    Past Medical History:  Diagnosis Date   Epilepsy (HCC)    Last seizure 6 months ago. Self-discontinued Keppra due to cost.   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 Authorizing Provider  ferrous sulfate 325 (65 FE) MG tablet Take 325 mg by mouth daily with breakfast.  09/08/19  [provider]  norethindrone (MICRONOR) 0.35 MG tablet Take 1 tablet (0.35 mg total) by mouth daily. Start 4 wks postpartum Patient not taking: Reported on 05/10/2019 05/03/19 09/08/19  Doreene Burke, CNM    Family History Family History  Problem  Relation Age of Onset   Healthy Mother    Healthy Father     Social History Social History   Tobacco Use   Smoking status: Former Smoker    Quit date: 09/08/2018    Years since quitting: 1.0   Smokeless tobacco: Never Used  Vaping Use   Vaping Use: Some days  Substance Use Topics   Alcohol use: Yes    Alcohol/week: 5.0 standard drinks    Types: 5 Cans of beer per week    Comment: daily--"a lot"-a 5th probably   Drug use: Not Currently    Types: Marijuana     Allergies   Oxycodone hcl   Review of Systems Review of Systems  Constitutional: Negative for activity change, appetite change, chills, fatigue and fever.  HENT: Positive for congestion and postnasal drip. Negative for ear discharge, ear pain, facial swelling, mouth sores, nosebleeds, sinus pressure, sinus pain, sneezing and trouble swallowing.   Eyes: Negative for pain, discharge, redness and itching.  Respiratory: Positive for cough. Negative for chest tightness, shortness of breath and wheezing.   Gastrointestinal: Negative for  diarrhea, nausea and vomiting.  Musculoskeletal: Negative for arthralgias, myalgias, neck pain and neck stiffness.  Skin: Negative for color change, rash and wound.  Allergic/Immunologic: Negative for environmental allergies, food allergies and immunocompromised state.  Neurological: Positive for seizures (none recently). Negative for dizziness, syncope, weakness, light-headedness and headaches.  Hematological: Negative for adenopathy. Does not bruise/bleed easily.     Physical Exam Triage Vital Signs ED Triage Vitals  Enc Vitals Group     BP 09/08/19 1222 125/80     Pulse Rate 09/08/19 1222 73     Resp 09/08/19 1222 18     Temp 09/08/19 1222 (!) 97.5 F (36.4 C)     Temp Source 09/08/19 1222 Oral     SpO2 09/08/19 1222 99 %     Weight 09/08/19 1221 149 lb (67.6 kg)     Height 09/08/19 1221 5\' 7"  (1.702 m)     Head Circumference --      Peak Flow --      Pain Score  09/08/19 1221 0     Pain Loc --      Pain Edu? --      Excl. in GC? --    No data found.  Updated Vital Signs BP 125/80 (BP Location: Left Arm)    Pulse 73    Temp (!) 97.5 F (36.4 C) (Oral)    Resp 18    Ht 5\' 7"  (1.702 m)    Wt 149 lb (67.6 kg)    LMP 09/07/2019 (Exact Date)    SpO2 99%    BMI 23.34 kg/m   Visual Acuity Right Eye Distance:   Left Eye Distance:   Bilateral Distance:    Right Eye Near:   Left Eye Near:    Bilateral Near:     Physical Exam Vitals and nursing note reviewed.  Constitutional:      General: She is awake. She is not in acute distress.    Appearance: Normal appearance. She is well-developed and well-groomed. She is not ill-appearing.     Comments: She is sitting comfortably on the exam table in no acute distress.   HENT:     Head: Normocephalic and atraumatic.     Right Ear: Hearing, tympanic membrane, ear canal and external ear normal.     Left Ear: Hearing, tympanic membrane, ear canal and external ear normal.     Nose: Congestion present.     Right Sinus: No maxillary sinus tenderness or frontal sinus tenderness.     Left Sinus: No maxillary sinus tenderness or frontal sinus tenderness.     Mouth/Throat:     Lips: Pink.     Mouth: Mucous membranes are moist.     Pharynx: Oropharynx is clear. Uvula midline. No pharyngeal swelling, oropharyngeal exudate, posterior oropharyngeal erythema or uvula swelling.  Eyes:     Extraocular Movements: Extraocular movements intact.     Conjunctiva/sclera: Conjunctivae normal.  Cardiovascular:     Rate and Rhythm: Normal rate and regular rhythm.     Heart sounds: Normal heart sounds. No murmur heard.   Pulmonary:     Effort: Pulmonary effort is normal. No respiratory distress.     Breath sounds: Normal breath sounds and air entry. No decreased air movement. No decreased breath sounds, wheezing, rhonchi or rales.  Musculoskeletal:        General: Normal range of motion.     Cervical back: Normal range of  motion and neck supple. No rigidity.  Lymphadenopathy:  Cervical: No cervical adenopathy.  Skin:    General: Skin is warm and dry.     Capillary Refill: Capillary refill takes less than 2 seconds.     Findings: No rash.  Neurological:     General: No focal deficit present.     Mental Status: She is alert and oriented to person, place, and time.  Psychiatric:        Mood and Affect: Mood normal.        Behavior: Behavior normal. Behavior is cooperative.        Thought Content: Thought content normal.        Judgment: Judgment normal.      UC Treatments / Results  Labs (all labs ordered are listed, but only abnormal results are displayed) Labs Reviewed - No data to display  EKG   Radiology No results found.  Procedures Procedures (including critical care time)  Medications Ordered in UC Medications - No data to display  Initial Impression / Assessment and Plan / UC Course  I have reviewed the triage vital signs and the nursing notes.  Pertinent labs & imaging results that were available during my care of the patient were reviewed by me and considered in my medical decision making (see chart for details).    Reviewed with patient that she probably has a viral URI- still possibility of COVID 19 but patient refuses to get tested. Recommend continue OTC cold/cough medication as needed. Note written for work- may return tomorrow as long as she does not have a fever and is feeling better. Follow-up in 3 to 4 days if not resolving.  Final Clinical Impressions(s) / UC Diagnoses   Final diagnoses:  Viral URI with cough     Discharge Instructions     Recommend continue OTC mucus/cold medication and Nyquil as needed for symptom relief. Continue to push fluids to help loosen up any mucus in chest. Follow-up in 3 to 4 days if not resolving.     ED Prescriptions    None     PDMP not reviewed this encounter.   Sudie Grumbling, NP 09/09/19 2328

## 2019-10-08 DIAGNOSIS — Z419 Encounter for procedure for purposes other than remedying health state, unspecified: Secondary | ICD-10-CM | POA: Diagnosis not present

## 2019-10-18 ENCOUNTER — Other Ambulatory Visit: Payer: Self-pay

## 2019-10-18 ENCOUNTER — Ambulatory Visit
Admission: EM | Admit: 2019-10-18 | Discharge: 2019-10-18 | Disposition: A | Discharge status: Home / Self Care | Payer: Medicaid Other | Attending: Family Medicine | Admitting: Family Medicine

## 2019-10-18 DIAGNOSIS — R112 Nausea with vomiting, unspecified: Secondary | ICD-10-CM

## 2019-10-18 MED ORDER — ONDANSETRON HCL 4 MG PO TABS: 4.0000 mg | ORAL_TABLET | Freq: Three times a day (TID) | ORAL | 0 refills | Status: DC | PRN

## 2019-10-18 NOTE — ED Triage Notes (Addendum)
Patient in today w/ N/V x 2 days. Patient states it started Monday. Will need work note for yesterday and today in order to return to work tomorrow. Patient took Tylenol. Patient denies fever. Patient refused to be tested for COVID.

## 2019-10-19 NOTE — ED Provider Notes (Signed)
[facility]    CSN: [id number] Arrival date & time: 10/18/19  1523      History   Chief Complaint Chief Complaint  Patient presents with  . Nausea  . Emesis   HPI   24 year old female presents with the above complaints.  Patient reports that her symptoms started on Monday.  She had nausea.  Had continued nausea on Tuesday and emesis x2.  She has since then improved.  She has had no further vomiting.  Patient states that she is feeling better.  No fever.  No abdominal pain.  Declines Covid testing today.  Needs a note that she can return to work.  No other associated symptoms.  No other complaints.  Past Medical History:  Diagnosis Date  . Epilepsy (HCC)    Last seizure 6 months ago. Self-discontinued Keppra due to cost.  . History of chlamydia    treated  . History of pelvic inflammatory disease    treated  . History of urinary tract infection   . PTSD (post-traumatic stress disorder)   . Seizures (HCC)    epilepsy    Patient Active Problem List   Diagnosis Date Noted  . Normal labor 05/02/2019  . 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 History of epilepsy 11/18/2018    Past Surgical History:  Procedure Laterality Date  . KNEE SURGERY      OB History    Gravida  2   Para  2   Term  2   Preterm      AB      Living  2     SAB      TAB      Ectopic      Multiple  0   Live Births  2            Home Medications    Prior to Admission medications   Medication Sig Start Date End Date Taking? Authorizing Provider  ondansetron (ZOFRAN) 4 MG tablet Take 1 tablet (4 mg total) by mouth every 8 (eight) hours as needed for nausea or vomiting. 10/18/19   Tommie Sams, DO  ferrous sulfate 325 (65 FE) MG tablet Take 325 mg by mouth daily with breakfast.  09/08/19  [provider]  norethindrone (MICRONOR) 0.35 MG tablet Take 1 tablet (0.35 mg total) by mouth daily. Start 4 wks postpartum Patient not taking: Reported on 05/10/2019  05/03/19 09/08/19  Doreene Burke, CNM    Family History Family History  Problem Relation Age of Onset  . Healthy Mother   . Healthy Father     Social History Social History   Tobacco Use  . Smoking status: Former Smoker    Quit date: 09/08/2018    Years since quitting: 1.1  . Smokeless tobacco: Never Used  Vaping Use  . Vaping Use: Some days  Substance Use Topics  . Alcohol use: Yes    Alcohol/week: 5.0 standard drinks    Types: 5 Cans of beer per week    Comment: daily--"a lot"-a 5th probably  . Drug use: Not Currently    Types: Marijuana     Allergies   Oxycodone hcl   Review of Systems Review of Systems  Constitutional: Negative for fever.  Gastrointestinal: Positive for nausea and vomiting.   Physical Exam Triage Vital Signs ED Triage Vitals  Enc Vitals Group     BP 10/18/19 1602 115/71     Pulse Rate 10/18/19 1602 73     Resp 10/18/19 1602 18  Temp 10/18/19 1602 98.4 F (36.9 C)     Temp Source 10/18/19 1602 Oral     SpO2 10/18/19 1602 100 %     Weight 10/18/19 1606 148 lb (67.1 kg)     Height 10/18/19 1606 5\' 7"  (1.702 m)     Head Circumference --      Peak Flow --      Pain Score 10/18/19 1605 0     Pain Loc --      Pain Edu? --      Excl. in GC? --    Updated Vital Signs BP 115/71 (BP Location: Left Arm)   Pulse 73   Temp 98.4 F (36.9 C) (Oral)   Resp 18   Ht 5\' 7"  (1.702 m)   Wt 67.1 kg   LMP 10/11/2019 (Approximate)   SpO2 100%   BMI 23.18 kg/m   Visual Acuity Right Eye Distance:   Left Eye Distance:   Bilateral Distance:    Right Eye Near:   Left Eye Near:    Bilateral Near:     Physical Exam Constitutional:      General: She is not in acute distress.    Appearance: Normal appearance. She is not ill-appearing.  HENT:     Head: Normocephalic and atraumatic.  Eyes:     General:        Right eye: No discharge.        Left eye: No discharge.     Conjunctiva/sclera: Conjunctivae normal.  Cardiovascular:     Rate and  Rhythm: Normal rate and regular rhythm.     Heart sounds: No murmur heard.   Pulmonary:     Effort: Pulmonary effort is normal.     Breath sounds: Normal breath sounds. No wheezing or rales.  Abdominal:     General: There is no distension.     Palpations: Abdomen is soft.     Tenderness: There is no abdominal tenderness.  Neurological:     Mental Status: She is alert.  Psychiatric:        Mood and Affect: Mood normal.        Behavior: Behavior normal.    UC Treatments / Results  Labs (all labs ordered are listed, but only abnormal results are displayed) Labs Reviewed - No data to display  EKG   Radiology No results found.  Procedures Procedures (including critical care time)  Medications Ordered in UC Medications - No data to display  Initial Impression / Assessment and Plan / UC Course  I have reviewed the triage vital signs and the nursing notes.  Pertinent labs & imaging results that were available during my care of the patient were reviewed by me and considered in my medical decision making (see chart for details).    24 year old female presents with recent nausea vomiting.  Doing well at this time.  Zofran as needed.  Work note given.  Final Clinical Impressions(s) / UC Diagnoses   Final diagnoses:  Non-intractable vomiting with nausea, unspecified vomiting type   Discharge Instructions   None    ED Prescriptions    Medication Sig Dispense Auth. Provider   ondansetron (ZOFRAN) 4 MG tablet Take 1 tablet (4 mg total) by mouth every 8 (eight) hours as needed for nausea or vomiting. 20 tablet 12/11/2019, DO     PDMP not reviewed this encounter.   [address] [employer], [place] Other 10/19/19 [phone]

## 2019-11-07 IMAGING — US US TRANSVAGINAL NON-OB
1 series · 13 of 25 positions shown · non-contrast
Comparison: None.

CLINICAL DATA: Pelvic pain and bleeding for 2 days.

EXAM:
TRANSABDOMINAL AND TRANSVAGINAL ULTRASOUND OF PELVIS
DOPPLER ULTRASOUND OF OVARIES
TECHNIQUE: Both transabdominal and transvaginal ultrasound examinations of the
pelvis were performed. Transabdominal technique was performed for
global imaging of the pelvis including uterus, ovaries, adnexal
regions, and pelvic cul-de-sac.
It was necessary to proceed with endovaginal exam following the
transabdominal exam to visualize the adnexal structures to an
adequate degree. Color and duplex Doppler ultrasound was utilized to
evaluate blood flow to the ovaries.

[Series 1: us transvaginal non-ob · 0.20mm/px · 13 of 80 slices shown]
[im 1/80]
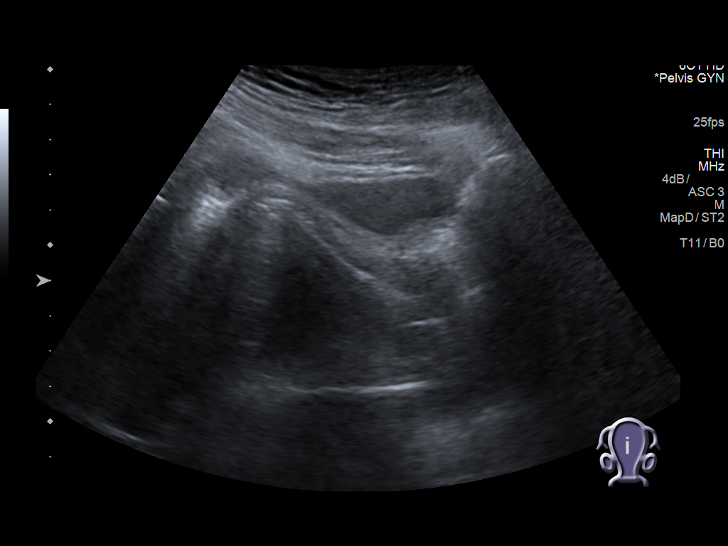
[im 7/80]
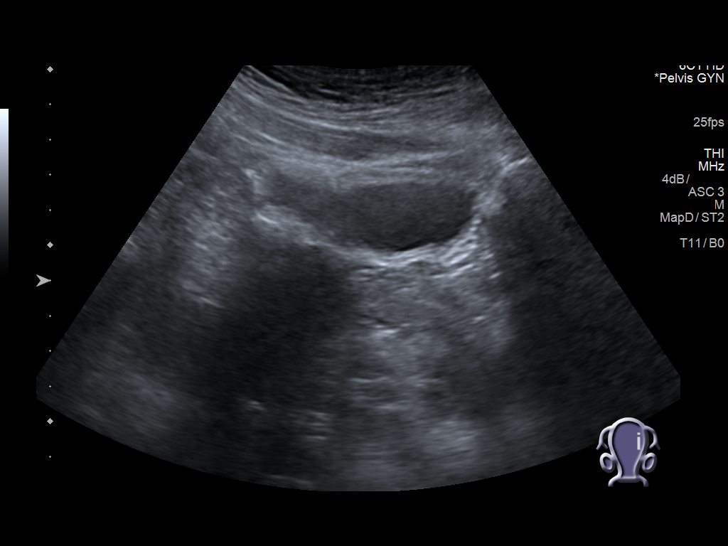
[im 14/80]
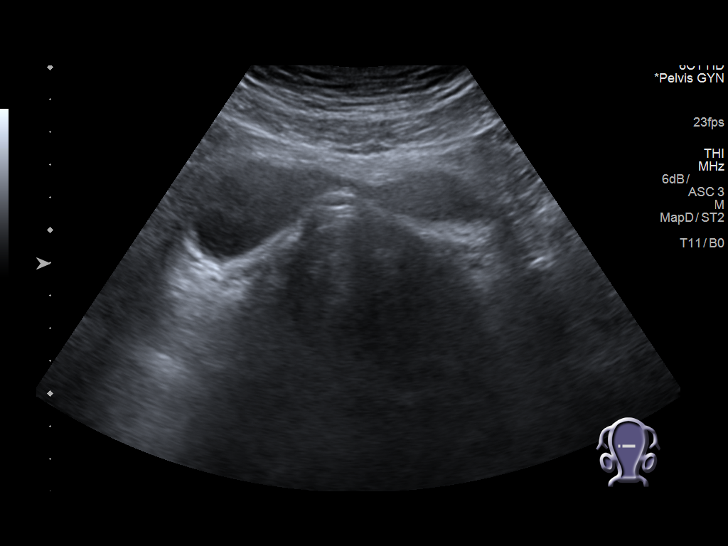
[im 20/80]
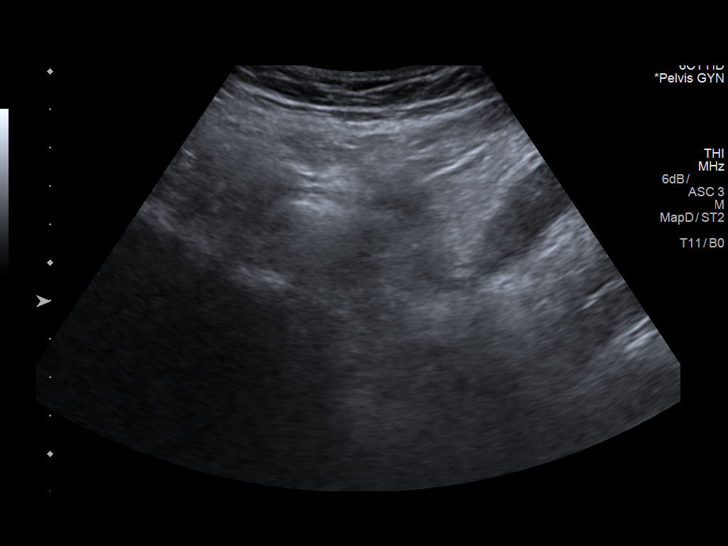
[im 27/80]
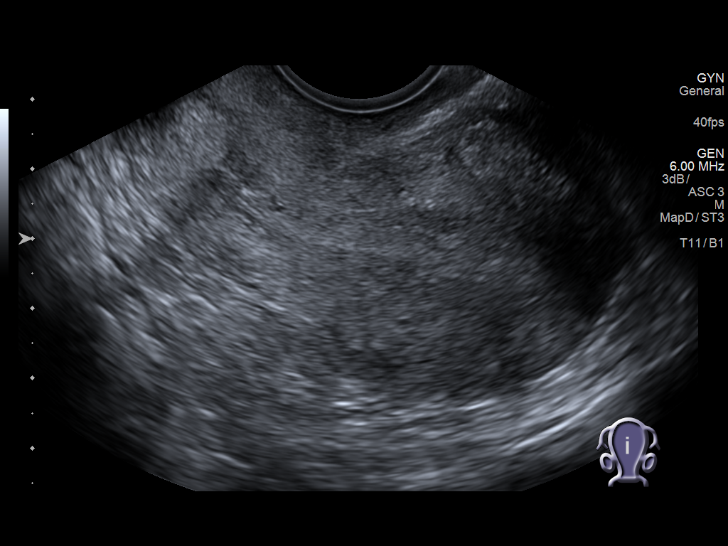
[im 33/80]
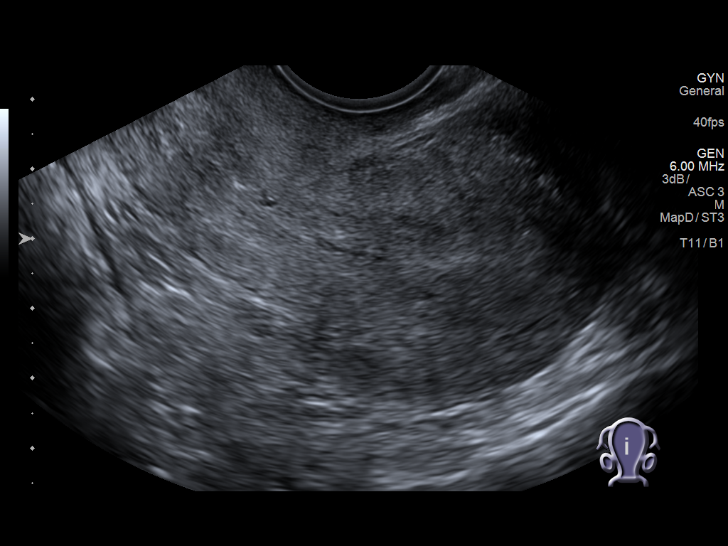
[im 40/80]
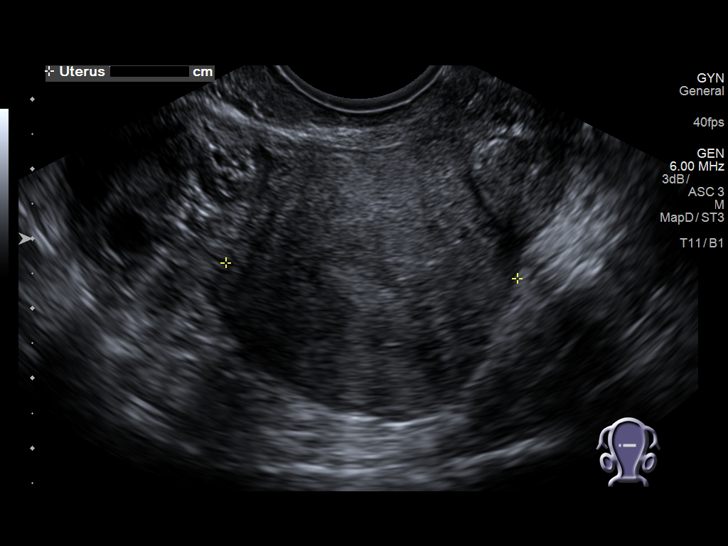
[im 47/80]
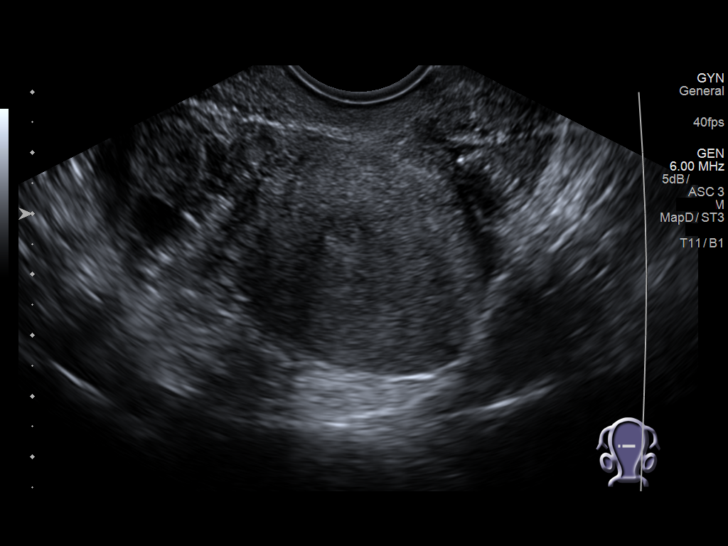
[im 53/80]
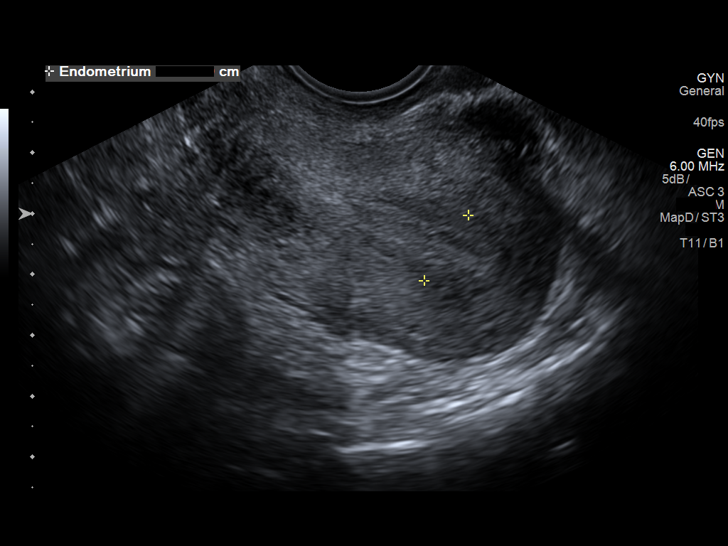
[im 60/80]
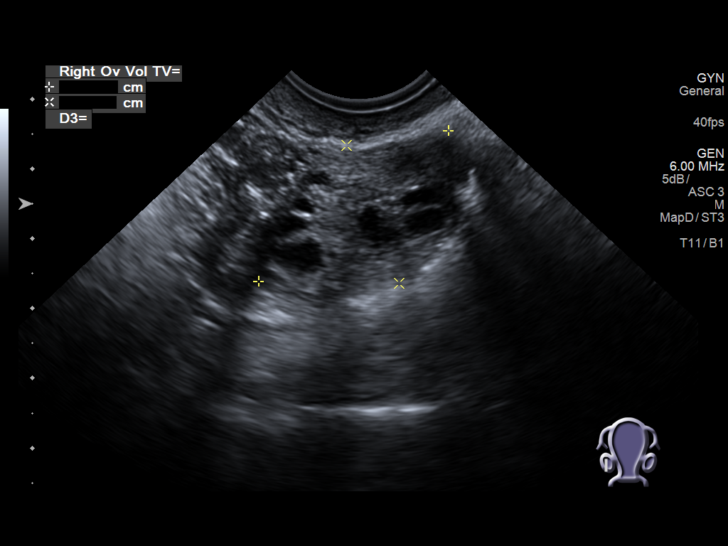
[im 66/80]
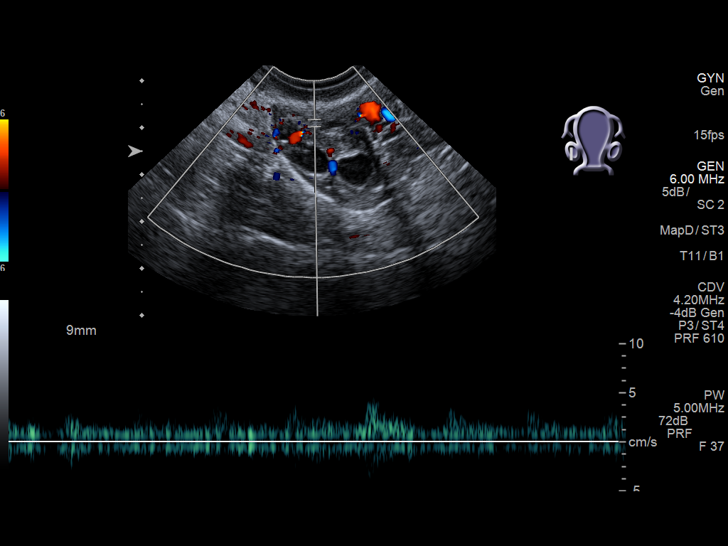
[im 73/80]
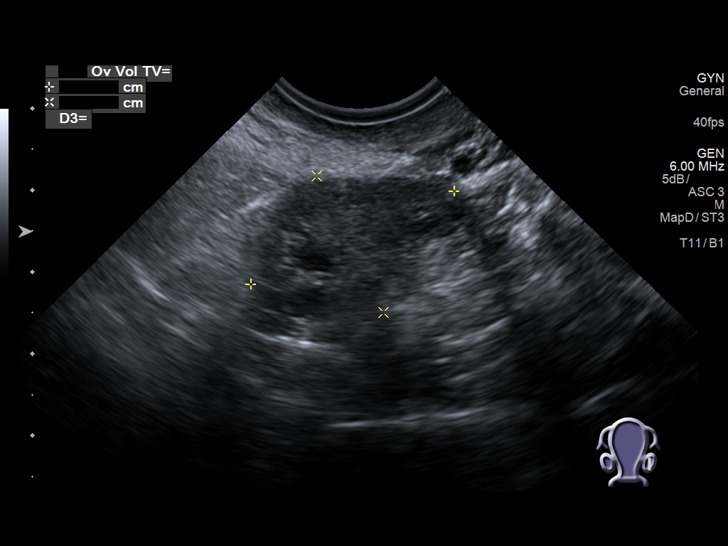
[im 80/80]
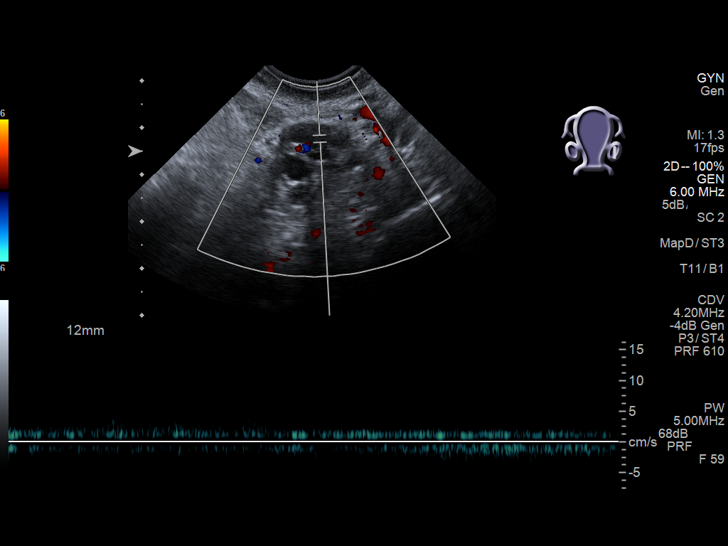

[13 of 25 positions shown; findings below may reference images not displayed]

FINDINGS: Uterus

Measurements: 7.7 x 4.4 x 4.2 cm. No fibroids or other mass
visualized.

Endometrium

Thickness: 13 mm.  No focal abnormality visualized.

Right ovary

Measurements: 3.5 x 2.1 x 2.4 cm. Normal appearance/no adnexal mass.

Left ovary

Measurements: 2.7 x 1.9 x 1.7 cm. Normal appearance/no adnexal mass.

Pulsed Doppler evaluation of both ovaries demonstrates normal
low-resistance arterial and venous waveforms.

Other findings

No abnormal free fluid.
IMPRESSION: Normal pelvic ultrasound. No adnexal mass or free fluid. No evidence
of ovarian torsion.

## 2019-11-08 DIAGNOSIS — Z419 Encounter for procedure for purposes other than remedying health state, unspecified: Secondary | ICD-10-CM | POA: Diagnosis not present

## 2019-12-08 DIAGNOSIS — Z419 Encounter for procedure for purposes other than remedying health state, unspecified: Secondary | ICD-10-CM | POA: Diagnosis not present

## 2020-01-08 DIAGNOSIS — Z419 Encounter for procedure for purposes other than remedying health state, unspecified: Secondary | ICD-10-CM | POA: Diagnosis not present

## 2020-02-07 DIAGNOSIS — Z419 Encounter for procedure for purposes other than remedying health state, unspecified: Secondary | ICD-10-CM | POA: Diagnosis not present

## 2020-03-09 DIAGNOSIS — Z419 Encounter for procedure for purposes other than remedying health state, unspecified: Secondary | ICD-10-CM | POA: Diagnosis not present

## 2020-04-09 DIAGNOSIS — Z419 Encounter for procedure for purposes other than remedying health state, unspecified: Secondary | ICD-10-CM | POA: Diagnosis not present

## 2020-05-07 DIAGNOSIS — Z419 Encounter for procedure for purposes other than remedying health state, unspecified: Secondary | ICD-10-CM | POA: Diagnosis not present

## 2020-06-07 DIAGNOSIS — Z419 Encounter for procedure for purposes other than remedying health state, unspecified: Secondary | ICD-10-CM | POA: Diagnosis not present

## 2020-07-07 DIAGNOSIS — Z419 Encounter for procedure for purposes other than remedying health state, unspecified: Secondary | ICD-10-CM | POA: Diagnosis not present

## 2020-07-16 ENCOUNTER — Emergency Department: Payer: Medicaid Other

## 2020-07-16 ENCOUNTER — Emergency Department
Admission: EM | Admit: 2020-07-16 | Discharge: 2020-07-16 | Disposition: A | Discharge status: Home / Self Care | Payer: Medicaid Other | Attending: Emergency Medicine | Admitting: Emergency Medicine

## 2020-07-16 ENCOUNTER — Other Ambulatory Visit: Payer: Self-pay

## 2020-07-16 DIAGNOSIS — Z87891 Personal history of nicotine dependence: Secondary | ICD-10-CM | POA: Insufficient documentation

## 2020-07-16 DIAGNOSIS — R509 Fever, unspecified: Secondary | ICD-10-CM | POA: Diagnosis not present

## 2020-07-16 DIAGNOSIS — J069 Acute upper respiratory infection, unspecified: Secondary | ICD-10-CM | POA: Diagnosis not present

## 2020-07-16 DIAGNOSIS — U071 COVID-19: Secondary | ICD-10-CM | POA: Insufficient documentation

## 2020-07-16 DIAGNOSIS — Z28310 Unvaccinated for covid-19: Secondary | ICD-10-CM | POA: Insufficient documentation

## 2020-07-16 DIAGNOSIS — R059 Cough, unspecified: Secondary | ICD-10-CM | POA: Diagnosis not present

## 2020-07-16 DIAGNOSIS — B9789 Other viral agents as the cause of diseases classified elsewhere: Secondary | ICD-10-CM | POA: Diagnosis not present

## 2020-07-16 HISTORY — DX: COVID-19: U07.1

## 2020-07-16 LAB — GROUP A STREP BY PCR: Group A Strep by PCR: NOT DETECTED

## 2020-07-16 LAB — RESP PANEL BY RT-PCR (FLU A&B, COVID) ARPGX2
Influenza A by PCR: NEGATIVE
Influenza B by PCR: NEGATIVE
SARS Coronavirus 2 by RT PCR: POSITIVE — AB

## 2020-07-16 MED ORDER — ONDANSETRON 4 MG PO TBDP: 4.0000 mg | ORAL_TABLET | Freq: Three times a day (TID) | ORAL | 0 refills | Status: DC | PRN

## 2020-07-16 MED ORDER — METOCLOPRAMIDE HCL 10 MG PO TABS
10.0000 mg | ORAL_TABLET | Freq: Once | ORAL | Status: AC
  Administered 2020-07-16: 10 mg via ORAL
  Filled 2020-07-16: qty 1

## 2020-07-16 MED ORDER — ACETAMINOPHEN 325 MG PO TABS
650.0000 mg | ORAL_TABLET | Freq: Once | ORAL | Status: AC
  Administered 2020-07-16: 650 mg via ORAL
  Filled 2020-07-16: qty 2

## 2020-07-16 NOTE — Discharge Instructions (Signed)
Your symptoms and presentation are concerning for viral respiratory infection including influenza or COVID.  Your results are pending at this time.  Continue to monitor symptoms and treat fevers as necessary.  Hydrate to prevent dehydration.  Follow-up with your primary provider return to the ED if needed.

## 2020-07-16 NOTE — ED Provider Notes (Signed)
[facility] Emergency Department Provider Note ____________________________________________  Time seen: 1535  I have reviewed the triage vital signs and the nursing notes.  HISTORY  Chief Complaint  Fever and Cough  HPI Tamara Barron is a 25 y.o. female with the below medical history, presents herself to the ED for evaluation of fever, body aches, and cough onset yesterday.  Patient reports T-max at home of 3 F.  She took Tylenol prior to arrival.  She presents now for evaluation of her symptoms.   Past Medical History:  Diagnosis Date  . Epilepsy (HCC)    Last seizure 6 months ago. Self-discontinued Keppra due to cost.  . History of chlamydia    treated  . History of pelvic inflammatory disease    treated  . History of urinary tract infection   . PTSD (post-traumatic stress disorder)   . Seizures (HCC)    epilepsy    Patient Active Problem List   Diagnosis Date Noted  . Normal labor 05/02/2019  . 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 History of epilepsy 11/18/2018    Past Surgical History:  Procedure Laterality Date  . KNEE SURGERY      Prior to Admission medications   Medication Sig Start Date End Date Taking? Authorizing Provider  ondansetron (ZOFRAN ODT) 4 MG disintegrating tablet Take 1 tablet (4 mg total) by mouth every 8 (eight) hours as needed. 07/16/20  Yes Arlene Brickel, Charlesetta Ivory, PA-C  ferrous sulfate 325 (65 FE) MG tablet Take 325 mg by mouth daily with breakfast.  09/08/19  [provider]  norethindrone (MICRONOR) 0.35 MG tablet Take 1 tablet (0.35 mg total) by mouth daily. Start 4 wks postpartum Patient not taking: Reported on 05/10/2019 05/03/19 09/08/19  Doreene Burke, CNM    Allergies Oxycodone hcl  Family History  Problem Relation Age of Onset  . Healthy Mother   . Healthy Father     Social History Social History   Tobacco Use  . Smoking status: Former Smoker    Quit date: 09/08/2018    Years since quitting:  1.8  . Smokeless tobacco: Never Used  Vaping Use  . Vaping Use: Some days  Substance Use Topics  . Alcohol use: Yes    Alcohol/week: 5.0 standard drinks    Types: 5 Cans of beer per week    Comment: daily--"a lot"-a 5th probably  . Drug use: Not Currently    Types: Marijuana    Review of Systems  Constitutional: Positive for fever. Eyes: Negative for visual changes. ENT: Negative for sore throat. Cardiovascular: Negative for chest pain. Respiratory: Negative for shortness of breath. Reports cough Gastrointestinal: Negative for abdominal pain, vomiting and diarrhea. Genitourinary: Negative for dysuria. Musculoskeletal: Negative for back pain. Reports generalized bodyaches.  Skin: Negative for rash. Neurological: Negative for headaches, focal weakness or numbness. ____________________________________________  PHYSICAL EXAM:  VITAL SIGNS: ED Triage Vitals  Enc Vitals Group     BP 07/16/20 1531 136/76     Pulse Rate 07/16/20 1531 (!) 121     Resp 07/16/20 1531 18     Temp 07/16/20 1531 98.6 F (37 C)     Temp Source 07/16/20 1531 Oral     SpO2 07/16/20 1531 97 %     Weight 07/16/20 1529 160 lb (72.6 kg)     Height 07/16/20 1529 5\' 7"  (1.702 m)     Head Circumference --      Peak Flow --      Pain Score 07/16/20 1529 6  Pain Loc --      Pain Edu? --      Excl. in GC? --     Constitutional: Alert and oriented. Well appearing and in no distress. Head: Normocephalic and atraumatic. Eyes: Conjunctivae are normal. PERRL. Normal extraocular movements Ears: Canals clear. TMs intact bilaterally. Nose: No congestion/rhinorrhea/epistaxis. Mouth/Throat: Mucous membranes are moist. Neck: Supple. No thyromegaly. Hematological/Lymphatic/Immunological: No cervical lymphadenopathy. Cardiovascular: Normal rate, regular rhythm. Normal distal pulses. Respiratory: Normal respiratory effort. No wheezes/rales/rhonchi. Gastrointestinal: Soft and nontender. No  distention. Musculoskeletal: Nontender with normal range of motion in all extremities.  Neurologic:  Normal gait without ataxia. Normal speech and language. No gross focal neurologic deficits are appreciated. Skin:  Skin is warm, dry and intact. No rash noted. Psychiatric: Mood and affect are normal. Patient exhibits appropriate insight and judgment. ____________________________________________   LABS (pertinent positives/negatives)  Labs Reviewed  RESP PANEL BY RT-PCR (FLU A&B, COVID) ARPGX2 - Abnormal; Notable for the following components:      Result Value   SARS Coronavirus 2 by RT PCR POSITIVE (*)    All other components within normal limits  GROUP A STREP BY PCR  ____________________________________________   RADIOLOGY  CXR  IMPRESSION: No active cardiopulmonary disease. ____________________________________________  PROCEDURES  Tylenol 650 mg PO Reglan 10 mg PO  Procedures ____________________________________________   INITIAL IMPRESSION / ASSESSMENT AND PLAN / ED COURSE  As part of my medical decision making, I reviewed the following data within the electronic MEDICAL RECORD NUMBER Labs reviewed Covid +, Radiograph reviewed NAD and Notes from prior ED visits     DDX: strep throat, influenza, Covid, CAP    Patient ED evaluation of onset of body aches and fevers.  She was evaluated for symptoms with concern for possible viral etiology including flu, and/or COVID.  Patient is currently unvaccinated against either.  She was found to be tachycardic but afebrile on presentation.  She previously endorsed fevers prior to arrival.  She is without signs of acute dehydration, respiratory distress, or toxic appearance.  Her COVID swab screen is positive at this time.  She is discharged with a prescription for ondansetron to take as needed they should continue to monitor and treat fevers as necessary over-the-counter cough and cold medicine as needed.  Work is provided for the remainder  of this week.  Return precautions have been discussed.  Tamara Barron was evaluated in Emergency Department on 07/16/2020 for the symptoms described in the history of present illness. She was evaluated in the context of the global COVID-19 pandemic, which necessitated consideration that the patient might be at risk for infection with the SARS-CoV-2 virus that causes COVID-19. Institutional protocols and algorithms that pertain to the evaluation of patients at risk for COVID-19 are in a state of rapid change based on information released by regulatory bodies including the CDC and federal and state organizations. These policies and algorithms were followed during the patient's care in the ED. ____________________________________________  FINAL CLINICAL IMPRESSION(S) / ED DIAGNOSES  Final diagnoses:  Viral URI with cough  COVID-19      Lissa Hoard, PA-C 07/16/20 1832    Phineas Semen, MD 07/16/20 2007

## 2020-07-16 NOTE — ED Notes (Signed)
See triage note  Presents with body aches and fever  States she noticed some discomfort in legs last pm  But today body aches were worse  Afebrile at present

## 2020-07-16 NOTE — ED Triage Notes (Addendum)
Fever, body aches and cough that started yesterday. Pt alert and oriented X4, cooperative, RR even and unlabored, color WNL. Pt in NAD. Tylenol taken 45 minutes PTA. Fever of 101F at home

## 2020-07-17 ENCOUNTER — Telehealth: Payer: Self-pay | Admitting: Oncology

## 2020-07-17 ENCOUNTER — Telehealth: Payer: Self-pay | Admitting: *Deleted

## 2020-07-17 NOTE — Telephone Encounter (Signed)
Transition Care Management Unsuccessful Follow-up Telephone Call  Date of discharge and from where:  07/16/2020 [facility] ED  Attempts:  1st Attempt  Reason for unsuccessful TCM follow-up call:  Left voice message

## 2020-07-17 NOTE — Telephone Encounter (Signed)
Called to discuss with patient about COVID-19 symptoms and the use of one of the available treatments for those with mild to moderate Covid symptoms and at a high risk of hospitalization.  Pt appears to qualify for outpatient treatment due to co-morbid conditions and/or a member of an at-risk group in accordance with the FDA Emergency Use Authorization.    Symptom onset: 07/15/20 Vaccinated: No Booster? No Immunocompromised? No Qualifiers:  Past Medical History:  Diagnosis Date  . Epilepsy (HCC)    Last seizure 6 months ago. Self-discontinued Keppra due to cost.  . History of chlamydia    treated  . History of pelvic inflammatory disease    treated  . History of urinary tract infection   . PTSD (post-traumatic stress disorder)   . Seizures (HCC)    epilepsy    Unable to reach pt - Unable to reach patient. No VM. Sent Text message.   Mauro Kaufmann

## 2020-07-18 NOTE — Telephone Encounter (Signed)
Transition Care Management Unsuccessful Follow-up Telephone Call  Date of discharge and from where:  07/16/2020 from [facility]  Attempts:  2nd Attempt  Reason for unsuccessful TCM follow-up call:  Unable to leave message

## 2020-07-19 NOTE — Telephone Encounter (Signed)
Transition Care Management Unsuccessful Follow-up Telephone Call  Date of discharge and from where:  07/16/2020 from [facility]  Attempts:  3rd Attempt  Reason for unsuccessful TCM follow-up call:  Unable to reach patient

## 2020-08-07 DIAGNOSIS — Z419 Encounter for procedure for purposes other than remedying health state, unspecified: Secondary | ICD-10-CM | POA: Diagnosis not present

## 2020-09-06 DIAGNOSIS — Z419 Encounter for procedure for purposes other than remedying health state, unspecified: Secondary | ICD-10-CM | POA: Diagnosis not present

## 2020-10-07 DIAGNOSIS — Z419 Encounter for procedure for purposes other than remedying health state, unspecified: Secondary | ICD-10-CM | POA: Diagnosis not present

## 2020-11-05 DIAGNOSIS — R1011 Right upper quadrant pain: Secondary | ICD-10-CM | POA: Diagnosis not present

## 2020-11-05 DIAGNOSIS — R197 Diarrhea, unspecified: Secondary | ICD-10-CM | POA: Insufficient documentation

## 2020-11-05 DIAGNOSIS — F17210 Nicotine dependence, cigarettes, uncomplicated: Secondary | ICD-10-CM | POA: Diagnosis not present

## 2020-11-05 DIAGNOSIS — R1110 Vomiting, unspecified: Secondary | ICD-10-CM | POA: Diagnosis not present

## 2020-11-05 DIAGNOSIS — R112 Nausea with vomiting, unspecified: Secondary | ICD-10-CM | POA: Diagnosis not present

## 2020-11-05 DIAGNOSIS — R000 Tachycardia, unspecified: Secondary | ICD-10-CM | POA: Diagnosis not present

## 2020-11-05 DIAGNOSIS — Z20822 Contact with and (suspected) exposure to covid-19: Secondary | ICD-10-CM | POA: Insufficient documentation

## 2020-11-05 DIAGNOSIS — Z8616 Personal history of COVID-19: Secondary | ICD-10-CM | POA: Diagnosis not present

## 2020-11-05 DIAGNOSIS — R109 Unspecified abdominal pain: Secondary | ICD-10-CM | POA: Diagnosis not present

## 2020-11-05 NOTE — ED Triage Notes (Signed)
Pt reports right side abd pain   sx for 3 days.  Pt has vomiting and diarrhea.  Pt reports chills.  [place] states [place] [employer] 2 days [place] and got sick afterwards   pt alert  speech clear.

## 2020-11-06 ENCOUNTER — Other Ambulatory Visit: Payer: Self-pay

## 2020-11-06 ENCOUNTER — Emergency Department
Admission: EM | Admit: 2020-11-06 | Discharge: 2020-11-06 | Disposition: A | Discharge status: Home / Self Care | Payer: Medicaid Other | Attending: Emergency Medicine | Admitting: Emergency Medicine

## 2020-11-06 ENCOUNTER — Encounter: Payer: Self-pay | Admitting: *Deleted

## 2020-11-06 ENCOUNTER — Emergency Department: Payer: Medicaid Other

## 2020-11-06 DIAGNOSIS — R197 Diarrhea, unspecified: Secondary | ICD-10-CM | POA: Diagnosis not present

## 2020-11-06 DIAGNOSIS — R112 Nausea with vomiting, unspecified: Secondary | ICD-10-CM | POA: Diagnosis not present

## 2020-11-06 DIAGNOSIS — R109 Unspecified abdominal pain: Secondary | ICD-10-CM | POA: Diagnosis not present

## 2020-11-06 DIAGNOSIS — R1110 Vomiting, unspecified: Secondary | ICD-10-CM | POA: Diagnosis not present

## 2020-11-06 DIAGNOSIS — R1011 Right upper quadrant pain: Secondary | ICD-10-CM | POA: Diagnosis not present

## 2020-11-06 DIAGNOSIS — R000 Tachycardia, unspecified: Secondary | ICD-10-CM | POA: Diagnosis not present

## 2020-11-06 LAB — RESP PANEL BY RT-PCR (FLU A&B, COVID) ARPGX2
Influenza A by PCR: NEGATIVE
Influenza B by PCR: NEGATIVE
SARS Coronavirus 2 by RT PCR: NEGATIVE

## 2020-11-06 LAB — URINALYSIS, COMPLETE (UACMP) WITH MICROSCOPIC
Bacteria, UA: NONE SEEN
Bilirubin Urine: NEGATIVE
Glucose, UA: NEGATIVE mg/dL
Hgb urine dipstick: NEGATIVE
Ketones, ur: NEGATIVE mg/dL
Leukocytes,Ua: NEGATIVE
Nitrite: NEGATIVE
Protein, ur: NEGATIVE mg/dL
Specific Gravity, Urine: 1.016 (ref 1.005–1.030)
pH: 8 (ref 5.0–8.0)

## 2020-11-06 LAB — BASIC METABOLIC PANEL
Anion gap: 9 (ref 5–15)
BUN: 8 mg/dL (ref 6–20)
CO2: 27 mmol/L (ref 22–32)
Calcium: 9.2 mg/dL (ref 8.9–10.3)
Chloride: 102 mmol/L (ref 98–111)
Creatinine, Ser: 0.8 mg/dL (ref 0.44–1.00)
GFR, Estimated: >60 mL/min (ref >60)
Glucose, Bld: 105 mg/dL — ABNORMAL HIGH (ref 70–99)
Potassium: 3.5 mmol/L (ref 3.5–5.1)
Sodium: 138 mmol/L (ref 135–145)

## 2020-11-06 LAB — HEPATIC FUNCTION PANEL
ALT: 17 U/L (ref 0–44)
AST: 22 U/L (ref 15–41)
Albumin: 4.5 g/dL (ref 3.5–5.0)
Alkaline Phosphatase: 39 U/L (ref 38–126)
Bilirubin, Direct: <0.1 mg/dL (ref 0.0–0.2)
Total Bilirubin: 0.6 mg/dL (ref 0.3–1.2)
Total Protein: 7.6 g/dL (ref 6.5–8.1)

## 2020-11-06 LAB — CBC
HCT: 39.9 % (ref 36.0–46.0)
Hemoglobin: 13.9 g/dL (ref 12.0–15.0)
MCH: 33.6 pg (ref 26.0–34.0)
MCHC: 34.8 g/dL (ref 30.0–36.0)
MCV: 96.4 fL (ref 80.0–100.0)
Platelets: 402 10*3/uL — ABNORMAL HIGH (ref 150–400)
RBC: 4.14 MIL/uL (ref 3.87–5.11)
RDW: 12.9 % (ref 11.5–15.5)
WBC: 9.4 10*3/uL (ref 4.0–10.5)
nRBC: 0 % (ref 0.0–0.2)

## 2020-11-06 LAB — TROPONIN I (HIGH SENSITIVITY): Troponin I (High Sensitivity): 3 ng/L (ref <18)

## 2020-11-06 LAB — LIPASE, BLOOD: Lipase: 33 U/L (ref 11–51)

## 2020-11-06 LAB — PREGNANCY, URINE: Preg Test, Ur: NEGATIVE

## 2020-11-06 MED ORDER — ONDANSETRON 4 MG PO TBDP: ORAL_TABLET | ORAL | 0 refills | Status: DC

## 2020-11-06 MED ORDER — ONDANSETRON HCL 4 MG/2ML IJ SOLN
4.0000 mg | INTRAMUSCULAR | Status: AC
  Administered 2020-11-06: 4 mg via INTRAVENOUS
  Filled 2020-11-06: qty 2

## 2020-11-06 MED ORDER — LACTATED RINGERS IV BOLUS
1000.0000 mL | Freq: Once | INTRAVENOUS | Status: AC
  Administered 2020-11-06: 1000 mL via INTRAVENOUS

## 2020-11-06 MED ORDER — IOHEXOL 350 MG/ML SOLN
80.0000 mL | Freq: Once | INTRAVENOUS | Status: AC | PRN
  Administered 2020-11-06: 80 mL via INTRAVENOUS

## 2020-11-06 NOTE — Discharge Instructions (Addendum)

## 2020-11-06 NOTE — ED Provider Notes (Signed)
[facility] Emergency Department Provider Note  ____________________________________________   Event Date/Time   First MD Initiated Contact with Patient 11/06/20 0037     (approximate)  I have reviewed the triage vital signs and the nursing notes.   HISTORY  Chief Complaint Abdominal Pain    HPI Tamara Barron is a 25 y.o. female with history as listed below who presents for evaluation of about 2 days of persistent nausea and vomiting as well as some loose stools.  She is also having pain all throughout the right side of her body.  She states she is absolutely exhausted but she thinks that has to do with her 2 children at her job.  However not being able to eat or drink anything or keep any food or water down is making her feel worse.  Nothing in particular makes her feel better.  Moving around seems to make it feel little bit worse.  She describes the pain as sharp and aching and present all throughout the right side of her abdomen.  She has had no prior abdominal surgeries.  She denies fever, sore throat, chest pain, shortness of breath, and dysuria.     Past Medical History:  Diagnosis Date   COVID-19 07/16/2020   Epilepsy (HCC)    Last seizure 6 months ago. Self-discontinued Keppra due to cost.   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 Authorizing Provider  ondansetron (ZOFRAN ODT) 4 MG disintegrating tablet Allow 1-2 tablets to dissolve in your mouth every 8 hours as needed for  nausea/vomiting 11/06/20  Yes Loleta RoseForbach, Hazelee Harbold, MD  ferrous sulfate 325 (65 FE) MG tablet Take 325 mg by mouth daily with breakfast.  09/08/19  [provider]  norethindrone (MICRONOR) 0.35 MG tablet Take 1 tablet (0.35 mg total) by mouth daily. Start 4 wks postpartum Patient not taking: Reported on 05/10/2019 05/03/19 09/08/19  Doreene Burkehompson, Annie, CNM    Allergies Oxycodone hcl  Family History  Problem Relation Age of Onset   Healthy Mother    Healthy Father     Social History Social History   Tobacco Use   Smoking status: Every Day    Types: Cigarettes    Last attempt to quit: 09/08/2018    Years since quitting: 2.1   Smokeless tobacco: Never  Vaping Use   Vaping Use: Some days  Substance Use Topics   Alcohol use: Yes    Alcohol/week: 5.0 standard drinks    Types: 5 Cans of beer per week    Comment: daily--"a lot"-a 5th probably   Drug use: Not Currently    Types: Marijuana    Review of Systems Constitutional: No fever/chills.  Fatigue/exhaustion. Eyes: No visual changes. ENT: No sore throat. Cardiovascular: Denies chest pain. Respiratory: Denies shortness of breath. Gastrointestinal: 2 days of nausea and vomiting, occasional loose stool, right-sided abdominal pain. Genitourinary: Negative for dysuria. Musculoskeletal: Negative for neck pain.  Negative for back pain. Integumentary: Negative for rash.  Neurological: Negative for headaches, focal weakness or numbness.   ____________________________________________   PHYSICAL EXAM:  VITAL SIGNS: ED Triage Vitals  Enc Vitals Group     BP 11/05/20 2359 124/89     Pulse Rate 11/05/20 2359 97     Resp 11/05/20 2359 20     Temp 11/05/20 2359 98.7 F (37.1 C)     Temp Source 11/05/20 2359 Oral     SpO2 11/05/20 2359 100 %     Weight 11/06/20 0000 72.6 kg (160 lb)     Height 11/06/20 0000 1.702 m (5\' 7" )     Head Circumference --      Peak Flow --      Pain Score 11/06/20 0000 3     Pain Loc --      Pain Edu? --       Excl. in GC? --     Constitutional: Alert and oriented.  Eyes: Conjunctivae are normal.  Head: Atraumatic. Nose: No congestion/rhinnorhea. Mouth/Throat: Patient is wearing a mask. Neck: No stridor.  No meningeal signs.   Cardiovascular: Normal rate, regular rhythm. Good peripheral circulation. Respiratory: Normal respiratory effort.  No retractions. Gastrointestinal: Soft and nondistended.  Exquisite tenderness to palpation of the right upper quadrant with positive Murphy sign.  No lower abdominal tenderness.  Mild epigastric tenderness. Musculoskeletal: No lower extremity tenderness nor edema. No gross deformities of extremities. Neurologic:  Normal speech and language. No gross focal neurologic deficits are appreciated.  Skin:  Skin is warm, dry and intact. Psychiatric: Mood and affect are normal. Speech and behavior are normal.  ____________________________________________   LABS (all labs ordered are listed, but only abnormal results are displayed)  Labs Reviewed  BASIC METABOLIC PANEL - Abnormal; Notable for the following components:      Result Value   Glucose, Bld 105 (*)    All other components within normal limits  CBC - Abnormal; Notable for the following components:   Platelets 402 (*)    All other components within normal limits  URINALYSIS, COMPLETE (UACMP) WITH MICROSCOPIC - Abnormal; Notable for the following components:   Color, Urine YELLOW (*)    APPearance CLEAR (*)    All other components within normal limits  RESP PANEL BY RT-PCR (FLU A&B, COVID) ARPGX2  LIPASE, BLOOD  HEPATIC FUNCTION PANEL  PREGNANCY, URINE  TROPONIN I (HIGH SENSITIVITY)   ____________________________________________  EKG  ED ECG REPORT I, 11/08/20, the attending physician, personally viewed and interpreted this ECG.  Date: 11/05/2020 EKG Time: 23: 57 Rate: 102 Rhythm: Sinus tach QRS Axis: normal Intervals: normal ST/T Wave abnormalities: Non-specific ST segment /  T-wave changes, but no clear evidence of acute ischemia. Narrative Interpretation: no definitive evidence of acute ischemia; does not meet STEMI criteria.  ED ECG REPORT I, 11/07/2020, the attending physician, personally viewed and interpreted this ECG.  Date: 11/06/2020 EKG Time: 23: 59 Rate: 98 Rhythm: normal sinus rhythm QRS Axis: normal Intervals: normal ST/T Wave abnormalities: Non-specific ST segment / T-wave changes, but no clear evidence of acute ischemia. Narrative Interpretation: no definitive evidence of acute ischemia; does not meet STEMI criteria.   ____________________________________________  RADIOLOGY I, 11/08/2020, personally viewed and evaluated these images (plain radiographs) as part of my medical decision making, as well as reviewing the written report by the radiologist.  ED MD interpretation:  No acute abnormalities identified on CT nor U/S.  Official radiology report(s): CT ABDOMEN PELVIS W CONTRAST  Result Date: 11/06/2020 CLINICAL DATA:  Right-sided abdominal  pain for 3 days with vomiting and diarrhea. EXAM: CT ABDOMEN AND PELVIS WITH CONTRAST TECHNIQUE: Multidetector CT imaging of the abdomen and pelvis was performed using the standard protocol following bolus administration of intravenous contrast. CONTRAST:  97mL OMNIPAQUE IOHEXOL 350 MG/ML SOLN COMPARISON:  None. FINDINGS: Lower chest:  Small sliding hiatal hernia. Hepatobiliary: No focal liver abnormality.No evidence of biliary obstruction or stone. Pancreas: Unremarkable. Spleen: Unremarkable. Adrenals/Urinary Tract: Negative adrenals. No hydronephrosis or stone. Unremarkable bladder. Stomach/Bowel:  No obstruction. No appendicitis. Vascular/Lymphatic: No acute vascular abnormality. No mass or adenopathy. Reproductive:No pathologic findings.  Tampon present. Other: No ascites or pneumoperitoneum. Musculoskeletal: No acute abnormalities. IMPRESSION: 1. No acute finding.  Negative for appendicitis. 2. Small  sliding hiatal hernia. Electronically Signed   By: Marnee Spring M.D.   On: 11/06/2020 04:32   US ABDOMEN LIMITED RUQ (LIVER/GB)  Result Date: 11/06/2020 CLINICAL DATA:  Right upper quadrant abdominal pain, nausea/vomiting EXAM: ULTRASOUND ABDOMEN LIMITED RIGHT UPPER QUADRANT COMPARISON:  None. FINDINGS: Gallbladder: No gallstones, gallbladder wall thickening, or pericholecystic fluid. Negative sonographic Murphy's sign. Common bile duct: Diameter: 4 mm Liver: Hyperechoic hepatic parenchyma, suggesting hepatic steatosis. No focal hepatic lesion is seen. Portal vein is patent on color Doppler imaging with normal direction of blood flow towards the liver. Other: None. IMPRESSION: Suspected hepatic steatosis. Otherwise negative right upper quadrant ultrasound. Electronically Signed   By: Charline Bills M.D.   On: 11/06/2020 02:43    ____________________________________________   PROCEDURES   Procedure(s) performed (including Critical Care):  .1-3 Lead EKG Interpretation  Date/Time: 11/06/2020 2:35 AM Performed by: Loleta Rose, MD Authorized by: Loleta Rose, MD     Interpretation: normal     ECG rate:  97   ECG rate assessment: normal     Rhythm: sinus rhythm     Ectopy: none     Conduction: normal     ____________________________________________   INITIAL IMPRESSION / MDM / ASSESSMENT AND PLAN / ED COURSE  As part of my medical decision making, I reviewed the following data within the electronic MEDICAL RECORD NUMBER Nursing notes reviewed and incorporated, Labs reviewed , Old chart reviewed, Notes from prior ED visits, and [place] Controlled Substance Database   Differential diagnosis includes, but is not limited to, biliary colic/cholecystitis, nonspecific gastritis, electrolyte or metabolic abnormality, dehydration.  The patient is on the cardiac monitor to evaluate for evidence of arrhythmia and/or significant heart rate changes.  Has been having at least 2 days of nausea and  vomiting with right-sided pain.  She had exquisite right upper quadrant pain with positive Murphy sign (she gasped when I pressed), which suggests to me that she may have cholelithiasis at a minimum.  Basic metabolic panel was unremarkable.  CBC was normal other than a predominance of platelets which may be due to volume depletion.  Lipase is normal.  Urine pregnancy test is negative.  Urinalysis is notable for a few white blood cells but no bacteria seen and no nitrites.  She also has no dysuria and no lower abdominal pains I think that UTI/pyelonephritis is unlikely.  Hepatic function panel is pending.  Respiratory viral panel is pending.  Right upper quadrant ultrasound is pending.  I ordered Zofran 4 mg IV and lactated Ringer 1 L IV and will reassess.     Clinical Course as of 11/06/20 0535  Wed Nov 06, 2020  0257 US ABDOMEN LIMITED RUQ (LIVER/GB) Unremarkable right upper quadrant other than the possibility of hepatic steatosis. [CF]  6222 SARS Coronavirus 2  by RT PCR: NEGATIVE [CF]  0457 CT ABDOMEN PELVIS W CONTRAST No acute abnormalities on CT scan.  I will reassess and paxlovid information about reassuring work-up. [CF]    Clinical Course User Index [CF] Loleta Rose, MD     ____________________________________________  FINAL CLINICAL IMPRESSION(S) / ED DIAGNOSES  Final diagnoses:  Nausea vomiting and diarrhea  Right sided abdominal pain     MEDICATIONS GIVEN DURING THIS VISIT:  Medications  lactated ringers bolus 1,000 mL (0 mLs Intravenous Stopped 11/06/20 0316)  ondansetron (ZOFRAN) injection 4 mg (4 mg Intravenous Given 11/06/20 0222)  iohexol (OMNIPAQUE) 350 MG/ML injection 80 mL (80 mLs Intravenous Contrast Given 11/06/20 0405)     ED Discharge Orders          Ordered    ondansetron (ZOFRAN ODT) 4 MG disintegrating tablet        11/06/20 0529             Note:  This document was prepared using Dragon voice recognition software and may include unintentional  dictation errors.   Loleta Rose, MD 11/06/20 [phone]

## 2020-11-07 ENCOUNTER — Telehealth: Payer: Self-pay | Admitting: *Deleted

## 2020-11-07 DIAGNOSIS — Z419 Encounter for procedure for purposes other than remedying health state, unspecified: Secondary | ICD-10-CM | POA: Diagnosis not present

## 2020-11-07 NOTE — Telephone Encounter (Signed)
Transition Care Management Follow-up Telephone Call Date of discharge and from where: 11/06/2020 [facility] ED How have you been since you were released from the hospital? "About the same' Any questions or concerns? No  Items Reviewed: Did the pt receive and understand the discharge instructions provided? Yes  Medications obtained and verified? Yes  Other? No  Any new allergies since your discharge? No  Dietary orders reviewed? No Do you have support at home? No    Functional Questionnaire: (I = Independent and D = Dependent) ADLs: I  Bathing/Dressing- I  Meal Prep- I  Eating- I  Maintaining continence- I  Transferring/Ambulation- I  Managing Meds- I  Follow up appointments reviewed:  PCP Hospital f/u appt confirmed? No   Specialist Hospital f/u appt confirmed? No   Are transportation arrangements needed? No  If their condition worsens, is the pt aware to call PCP or go to the Emergency Dept.? Yes Was the patient provided with contact information for the PCP's office or ED? Yes Was to pt encouraged to call back with questions or concerns? Yes

## 2020-12-07 DIAGNOSIS — Z419 Encounter for procedure for purposes other than remedying health state, unspecified: Secondary | ICD-10-CM | POA: Diagnosis not present

## 2021-01-07 DIAGNOSIS — Z419 Encounter for procedure for purposes other than remedying health state, unspecified: Secondary | ICD-10-CM | POA: Diagnosis not present

## 2021-02-06 DIAGNOSIS — Z419 Encounter for procedure for purposes other than remedying health state, unspecified: Secondary | ICD-10-CM | POA: Diagnosis not present

## 2021-03-09 DIAGNOSIS — Z419 Encounter for procedure for purposes other than remedying health state, unspecified: Secondary | ICD-10-CM | POA: Diagnosis not present

## 2021-04-09 DIAGNOSIS — Z419 Encounter for procedure for purposes other than remedying health state, unspecified: Secondary | ICD-10-CM | POA: Diagnosis not present

## 2021-05-07 DIAGNOSIS — Z419 Encounter for procedure for purposes other than remedying health state, unspecified: Secondary | ICD-10-CM | POA: Diagnosis not present

## 2021-06-07 DIAGNOSIS — Z419 Encounter for procedure for purposes other than remedying health state, unspecified: Secondary | ICD-10-CM | POA: Diagnosis not present

## 2021-07-07 DIAGNOSIS — Z419 Encounter for procedure for purposes other than remedying health state, unspecified: Secondary | ICD-10-CM | POA: Diagnosis not present

## 2021-08-07 DIAGNOSIS — Z419 Encounter for procedure for purposes other than remedying health state, unspecified: Secondary | ICD-10-CM | POA: Diagnosis not present

## 2021-09-06 DIAGNOSIS — Z419 Encounter for procedure for purposes other than remedying health state, unspecified: Secondary | ICD-10-CM | POA: Diagnosis not present

## 2021-10-05 DIAGNOSIS — S92351A Displaced fracture of fifth metatarsal bone, right foot, initial encounter for closed fracture: Secondary | ICD-10-CM | POA: Diagnosis not present

## 2021-10-05 DIAGNOSIS — F17210 Nicotine dependence, cigarettes, uncomplicated: Secondary | ICD-10-CM | POA: Diagnosis not present

## 2021-10-05 DIAGNOSIS — Z885 Allergy status to narcotic agent status: Secondary | ICD-10-CM | POA: Diagnosis not present

## 2021-10-05 DIAGNOSIS — S93401A Sprain of unspecified ligament of right ankle, initial encounter: Secondary | ICD-10-CM | POA: Diagnosis not present

## 2021-10-05 DIAGNOSIS — S92341A Displaced fracture of fourth metatarsal bone, right foot, initial encounter for closed fracture: Secondary | ICD-10-CM | POA: Diagnosis not present

## 2021-10-07 DIAGNOSIS — Z419 Encounter for procedure for purposes other than remedying health state, unspecified: Secondary | ICD-10-CM | POA: Diagnosis not present

## 2021-10-24 DIAGNOSIS — S99911A Unspecified injury of right ankle, initial encounter: Secondary | ICD-10-CM | POA: Diagnosis not present

## 2021-10-24 DIAGNOSIS — S92346A Nondisplaced fracture of fourth metatarsal bone, unspecified foot, initial encounter for closed fracture: Secondary | ICD-10-CM | POA: Diagnosis not present

## 2021-10-24 DIAGNOSIS — S92354D Nondisplaced fracture of fifth metatarsal bone, right foot, subsequent encounter for fracture with routine healing: Secondary | ICD-10-CM | POA: Diagnosis not present

## 2021-10-24 DIAGNOSIS — S92344D Nondisplaced fracture of fourth metatarsal bone, right foot, subsequent encounter for fracture with routine healing: Secondary | ICD-10-CM | POA: Diagnosis not present

## 2021-10-24 DIAGNOSIS — S92356A Nondisplaced fracture of fifth metatarsal bone, unspecified foot, initial encounter for closed fracture: Secondary | ICD-10-CM | POA: Diagnosis not present

## 2021-11-07 DIAGNOSIS — Z419 Encounter for procedure for purposes other than remedying health state, unspecified: Secondary | ICD-10-CM | POA: Diagnosis not present

## 2021-11-21 DIAGNOSIS — S92344D Nondisplaced fracture of fourth metatarsal bone, right foot, subsequent encounter for fracture with routine healing: Secondary | ICD-10-CM | POA: Diagnosis not present

## 2021-11-21 DIAGNOSIS — S92346A Nondisplaced fracture of fourth metatarsal bone, unspecified foot, initial encounter for closed fracture: Secondary | ICD-10-CM | POA: Diagnosis not present

## 2021-11-21 DIAGNOSIS — S99911A Unspecified injury of right ankle, initial encounter: Secondary | ICD-10-CM | POA: Diagnosis not present

## 2021-11-21 DIAGNOSIS — S92354D Nondisplaced fracture of fifth metatarsal bone, right foot, subsequent encounter for fracture with routine healing: Secondary | ICD-10-CM | POA: Diagnosis not present

## 2021-11-21 DIAGNOSIS — S92356A Nondisplaced fracture of fifth metatarsal bone, unspecified foot, initial encounter for closed fracture: Secondary | ICD-10-CM | POA: Diagnosis not present

## 2021-12-07 DIAGNOSIS — Z419 Encounter for procedure for purposes other than remedying health state, unspecified: Secondary | ICD-10-CM | POA: Diagnosis not present

## 2022-01-07 DIAGNOSIS — Z419 Encounter for procedure for purposes other than remedying health state, unspecified: Secondary | ICD-10-CM | POA: Diagnosis not present

## 2022-02-04 ENCOUNTER — Other Ambulatory Visit: Payer: Self-pay

## 2022-02-04 ENCOUNTER — Ambulatory Visit
Admission: EM | Admit: 2022-02-04 | Discharge: 2022-02-04 | Disposition: A | Discharge status: Home / Self Care | Payer: Medicaid Other | Attending: Physician Assistant | Admitting: Physician Assistant

## 2022-02-04 DIAGNOSIS — Z1152 Encounter for screening for COVID-19: Secondary | ICD-10-CM | POA: Diagnosis not present

## 2022-02-04 DIAGNOSIS — R197 Diarrhea, unspecified: Secondary | ICD-10-CM | POA: Diagnosis not present

## 2022-02-04 DIAGNOSIS — J302 Other seasonal allergic rhinitis: Secondary | ICD-10-CM | POA: Diagnosis not present

## 2022-02-04 DIAGNOSIS — R112 Nausea with vomiting, unspecified: Secondary | ICD-10-CM | POA: Diagnosis not present

## 2022-02-04 DIAGNOSIS — H6593 Unspecified nonsuppurative otitis media, bilateral: Secondary | ICD-10-CM | POA: Diagnosis not present

## 2022-02-04 LAB — RESP PANEL BY RT-PCR (RSV, FLU A&B, COVID)  RVPGX2
Influenza A by PCR: NEGATIVE
Influenza B by PCR: NEGATIVE
Resp Syncytial Virus by PCR: NEGATIVE
SARS Coronavirus 2 by RT PCR: NEGATIVE

## 2022-02-04 MED ORDER — FLUTICASONE PROPIONATE 50 MCG/ACT NA SUSP: 1.0000 | Freq: Every day | NASAL | 2 refills | Status: DC

## 2022-02-04 MED ORDER — ONDANSETRON 4 MG PO TBDP: 4.0000 mg | ORAL_TABLET | Freq: Three times a day (TID) | ORAL | 0 refills | Status: AC | PRN

## 2022-02-04 MED ORDER — CETIRIZINE HCL 10 MG PO TABS: 10.0000 mg | ORAL_TABLET | Freq: Every day | ORAL | 0 refills | Status: AC

## 2022-02-04 NOTE — ED Triage Notes (Signed)
Pt reports she has been queasy past couple days and had episode of vomiting this a.m

## 2022-02-04 NOTE — Discharge Instructions (Addendum)
-  Symptoms likely related to seasonal allergies and mucus/phlegm production. -Zyrtec: Take 1 tablet daily, recommend at night -Flonase: 1 spray in each nostril every morning -Zofran: 1 tablet placed under the tongue allowed to dissolve.  Can take every 8 hours as needed for nausea -Will call with any positive result from the nasal swab.  Results also be available in MyChart. -Would push fluids and recommend bland diet until symptoms improve -Follow-up with primary care as needed

## 2022-02-04 NOTE — ED Provider Notes (Signed)
[facility]    CSN: [id number] Arrival date & time: 02/04/22  9892      History   Chief Complaint Chief Complaint  Patient presents with   Emesis    HPI Tamara Barron is a 26 y.o. female.   Patient is a 26 year old female who presents with chief complaint of episodes of vomiting yesterday and this morning.  Patient also reporting phlegm in her throat and her chest.  Patient also reports feeling fatigued and lethargic last couple days.  She also reports some cold sweats at night.  She reports she works during the day and her child stays with his father (they are separated and living in different houses) and that the dad was diagnosed with a viral syndrome last week.  Son has some sneezing, coughing and had a couple episodes of diarrhea this morning.  She does not take anything over-the-counter for this.    Past Medical History:  Diagnosis Date   COVID-19 07/16/2020   Epilepsy (HCC)    Last seizure 6 months ago. Self-discontinued Keppra due to cost.   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 Authorizing Provider  cetirizine (ZYRTEC) 10 MG tablet Take 1 tablet (10 mg total) by mouth daily. 02/04/22  Yes Candis Schatz, PA-C  fluticasone (FLONASE) 50 MCG/ACT nasal spray Place 1 spray into both nostrils  daily. 02/04/22  Yes Candis Schatz, PA-C  ondansetron (ZOFRAN-ODT) 4 MG disintegrating tablet Take 1 tablet (4 mg total) by mouth every 8 (eight) hours as needed for nausea or vomiting. 02/04/22  Yes Candis Schatz, PA-C  ferrous sulfate 325 (65 FE) MG tablet Take 325 mg by mouth daily with breakfast.  09/08/19  [provider]  norethindrone (MICRONOR) 0.35 MG tablet Take 1 tablet (0.35 mg total) by mouth daily. Start 4 wks postpartum Patient not taking: Reported on 05/10/2019 05/03/19 09/08/19  Doreene Burke, CNM    Family History Family History  Problem Relation Age of Onset   Healthy Mother    Healthy Father     Social History Social History   Tobacco Use   Smoking status: Every Day    Types: Cigarettes   Smokeless tobacco: Never  Vaping Use   Vaping Use: Former  Substance Use Topics   Alcohol use: Yes    Alcohol/week: 5.0 standard drinks of alcohol    Types: 5 Cans of beer per week    Comment: twice weekly   Drug use: Not  Currently    Types: Marijuana     Allergies   Oxycodone hcl   Review of Systems Review of Systems as noted above in HPI.  Other systems reviewed and found to be negative   Physical Exam Triage Vital Signs ED Triage Vitals  Enc Vitals Group     BP 02/04/22 1011 (!) 125/92     Pulse Rate 02/04/22 1011 89     Resp 02/04/22 1011 17     Temp 02/04/22 1011 98 F (36.7 C)     Temp Source 02/04/22 1011 Oral     SpO2 02/04/22 1011 100 %     Weight 02/04/22 1009 168 lb (76.2 kg)     Height 02/04/22 1009 5' 6.5" (1.689 m)     Head Circumference --      Peak Flow --      Pain Score 02/04/22 1009 0     Pain Loc --      Pain Edu? --      Excl. in GC? --    No data found.  Updated Vital Signs BP (!) 125/92 (BP Location: Left Arm)   Pulse 89   Temp 98 F (36.7 C) (Oral)   Resp 17   Ht 5' 6.5" (1.689 m)   Wt 168 lb (76.2 kg)   LMP 01/21/2022   SpO2 100%   BMI 26.71 kg/m   Visual Acuity Right Eye Distance:   Left Eye  Distance:   Bilateral Distance:    Right Eye Near:   Left Eye Near:    Bilateral Near:     Physical Exam Constitutional:      Appearance: Normal appearance.  HENT:     Right Ear: Ear canal normal. A middle ear effusion is present. Tympanic membrane is not erythematous.     Left Ear: Ear canal normal. A middle ear effusion is present. Tympanic membrane is not erythematous.     Nose: Congestion present.     Right Sinus: No maxillary sinus tenderness or frontal sinus tenderness.     Left Sinus: No maxillary sinus tenderness or frontal sinus tenderness.     Mouth/Throat:     Mouth: Mucous membranes are moist.     Tonsils: 1+ on the right. 1+ on the left.     Comments: Clear postnasal drainage Cardiovascular:     Rate and Rhythm: Normal rate and regular rhythm.  Pulmonary:     Effort: Pulmonary effort is normal.     Breath sounds: No wheezing or rhonchi.  Musculoskeletal:     Cervical back: Normal range of motion.  Lymphadenopathy:     Cervical: No cervical adenopathy.  Neurological:     General: No focal deficit present.     Mental Status: She is alert and oriented to person, place, and time.  Psychiatric:        Mood and Affect: Mood normal.        Behavior: Behavior normal.      UC Treatments / Results  Labs (all labs ordered are listed, but only abnormal results are displayed) Labs Reviewed  RESP PANEL BY RT-PCR (RSV, FLU A&B, COVID)  [id number]    EKG   Radiology No results found.  Procedures Procedures (including critical care time)  Medications Ordered in UC Medications - No data to display  Initial Impression / Assessment and Plan / UC Course  I have reviewed the triage vital signs and the nursing notes.  Pertinent labs & imaging results that were available during my  care of the patient were reviewed by me and considered in my medical decision making (see chart for details).    Patient resents with episode of vomiting yesterday and this morning.  Patient  reports some sore throat in the morning.  Patient also reporting feeling lethargic and fatigued.  The father of patient's son has been sick over the last week and her son also had couple episodes of diarrhea this morning. Patient works in [employer].  Will check a viral swab. Final Clinical Impressions(s) / UC Diagnoses   Final diagnoses:  Nausea vomiting and diarrhea  Seasonal allergies  Fluid level behind tympanic membrane of both ears     Discharge Instructions      -Symptoms likely related to seasonal allergies and mucus/phlegm production. -Zyrtec: Take 1 tablet daily, recommend at night -Flonase: 1 spray in each nostril every morning -Zofran: 1 tablet placed under the tongue allowed to dissolve.  Can take every 8 hours as needed for nausea -Will call with any positive result from the nasal swab.  Results also be available in MyChart. -Would push fluids and recommend bland diet until symptoms improve -Follow-up with primary care as needed     ED Prescriptions     Medication Sig Dispense Auth. Provider   ondansetron (ZOFRAN-ODT) 4 MG disintegrating tablet Take 1 tablet (4 mg total) by mouth every 8 (eight) hours as needed for nausea or vomiting. 20 tablet Candis Schatz, PA-C   cetirizine (ZYRTEC) 10 MG tablet Take 1 tablet (10 mg total) by mouth daily. 30 tablet Candis Schatz, PA-C   fluticasone [facility]) 50 MCG/ACT nasal spray Place 1 spray into both nostrils daily. 16 g Candis Schatz, PA-C      PDMP not reviewed this encounter.   Candis Schatz, PA-C 02/04/22 1041

## 2022-02-06 DIAGNOSIS — Z419 Encounter for procedure for purposes other than remedying health state, unspecified: Secondary | ICD-10-CM | POA: Diagnosis not present

## 2022-02-09 ENCOUNTER — Ambulatory Visit
Admission: EM | Admit: 2022-02-09 | Discharge: 2022-02-09 | Disposition: A | Discharge status: Home / Self Care | Payer: Medicaid Other | Attending: Internal Medicine | Admitting: Internal Medicine

## 2022-02-09 ENCOUNTER — Emergency Department
Admission: EM | Admit: 2022-02-09 | Discharge: 2022-02-09 | Discharge status: Against Medical Advice | Payer: Medicaid Other | Attending: Emergency Medicine | Admitting: Emergency Medicine

## 2022-02-09 ENCOUNTER — Other Ambulatory Visit: Payer: Self-pay

## 2022-02-09 DIAGNOSIS — Z5321 Procedure and treatment not carried out due to patient leaving prior to being seen by health care provider: Secondary | ICD-10-CM | POA: Insufficient documentation

## 2022-02-09 DIAGNOSIS — H9202 Otalgia, left ear: Secondary | ICD-10-CM | POA: Diagnosis not present

## 2022-02-09 DIAGNOSIS — H6692 Otitis media, unspecified, left ear: Secondary | ICD-10-CM

## 2022-02-09 DIAGNOSIS — R059 Cough, unspecified: Secondary | ICD-10-CM | POA: Diagnosis not present

## 2022-02-09 MED ORDER — AMOXICILLIN-POT CLAVULANATE 875-125 MG PO TABS: 1.0000 | ORAL_TABLET | Freq: Two times a day (BID) | ORAL | 0 refills | Status: DC

## 2022-02-09 NOTE — Discharge Instructions (Signed)
Fill the Flonase prescribed to you on 11/29 and use it for 7 days along with the antibiotic You may take Tylenol or Ibuprofe up to 800 mg of the ibuprofen and 1000 mg of the tylenol every 8 hours for pain.

## 2022-02-09 NOTE — ED Triage Notes (Signed)
Pt reports her left ear popped in and out, she's in pain and she couldn't stand up last night. She took tylenol helped a little bit . whole left side of her face feels like someone is "stabbing her brain"  Hurts more with movement  Didn't get any meds filled from [facility]

## 2022-02-09 NOTE — ED Triage Notes (Signed)
C/O coughing all night and severe left ear pain.  STates unable to hear out of left ear.  AAOx3.  Skin warm and dry. NAD

## 2022-02-09 NOTE — ED Provider Notes (Signed)
[facility]    CSN: [id number] Arrival date & time: 02/09/22  1123      History   Chief Complaint No chief complaint on file.   HPI Tamara Barron is a 26 y.o. female who presents due to developing L ear pain last night and missed work today. She has had a cold since 11/27 and never filled the rx she was given. Denies fever, but was chilling last night. Needs a work note.    Past Medical History:  Diagnosis Date   COVID-19 07/16/2020   Epilepsy (HCC)    Last seizure 6 months ago. Self-discontinued Keppra due to cost.   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 Authorizing Provider  amoxicillin-clavulanate (AUGMENTIN) 875-125 MG tablet Take 1 tablet by mouth every 12 (twelve) hours. 02/09/22  Yes Rodriguez-Southworth, Nettie Elm, PA-C  cetirizine (ZYRTEC) 10 MG tablet Take 1 tablet (10 mg total) by mouth daily. 02/04/22   Candis Schatz, PA-C  fluticasone (FLONASE) 50 MCG/ACT nasal spray Place 1 spray into both nostrils daily. 02/04/22   Candis Schatz, PA-C  ondansetron (ZOFRAN-ODT) 4 MG disintegrating tablet Take 1 tablet (4 mg total) by mouth every 8 (eight) hours as needed for nausea or vomiting. 02/04/22   Candis Schatz, PA-C  ferrous sulfate 325 (65 FE) MG tablet Take 325 mg by mouth daily with  breakfast.  09/08/19  [provider]  norethindrone (MICRONOR) 0.35 MG tablet Take 1 tablet (0.35 mg total) by mouth daily. Start 4 wks postpartum Patient not taking: Reported on 05/10/2019 05/03/19 09/08/19  Doreene Burke, CNM    Family History Family History  Problem Relation Age of Onset   Healthy Mother    Healthy Father     Social History Social History   Tobacco Use   Smoking status: Every Day    Types: Cigarettes   Smokeless tobacco: Never  Vaping Use   Vaping Use: Former  Substance Use Topics   Alcohol use: Yes    Alcohol/week: 5.0 standard drinks of alcohol    Types: 5 Cans of beer per week    Comment: twice weekly   Drug use: Not Currently    Types: Marijuana     Allergies   Oxycodone hcl   Review of Systems Review of Systems  Constitutional:  Positive for chills. Negative for fatigue.  HENT:  Positive for ear pain and postnasal drip. Negative for ear discharge and sore throat.   Eyes:  Negative  for discharge.  Respiratory:  Negative for cough.   Musculoskeletal:  Negative for myalgias.  Neurological:  Negative for headaches.     Physical Exam Triage Vital Signs ED Triage Vitals  Enc Vitals Group     BP 02/09/22 1318 (!) 133/99     Pulse Rate 02/09/22 1318 99     Resp 02/09/22 1318 20     Temp 02/09/22 1318 98.2 F (36.8 C)     Temp Source 02/09/22 1318 Oral     SpO2 02/09/22 1318 98 %     Weight --      Height --      Head Circumference --      Peak Flow --      Pain Score 02/09/22 1323 6     Pain Loc --      Pain Edu? --      Excl. in GC? --    No data found.  Updated Vital Signs BP (!) 133/99 (BP Location: Left Arm)   Pulse 99   Temp 98.2 F (36.8 C) (Oral)   Resp 20   LMP 01/21/2022   SpO2 98%   Visual Acuity Right Eye Distance:   Left Eye Distance:   Bilateral Distance:    Right Eye Near:   Left Eye Near:    Bilateral Near:     Physical Exam Vitals and nursing note reviewed.  Constitutional:      General: She  is not in acute distress.    Appearance: She is not toxic-appearing.  HENT:     Right Ear: Tympanic membrane, ear canal and external ear normal.     Left Ear: Ear canal and external ear normal. Tympanic membrane is erythematous.     Ears:     Comments: L TM is flat Eyes:     General: No scleral icterus.    Conjunctiva/sclera: Conjunctivae normal.  Pulmonary:     Effort: Pulmonary effort is normal.  Musculoskeletal:        General: Normal range of motion.     Cervical back: Neck supple.  Lymphadenopathy:     Cervical: No cervical adenopathy.  Skin:    General: Skin is warm and dry.  Neurological:     Mental Status: She is alert and oriented to person, place, and time.     Gait: Gait normal.  Psychiatric:        Mood and Affect: Mood normal.        Behavior: Behavior normal.        Thought Content: Thought content normal.        Judgment: Judgment normal.      UC Treatments / Results  Labs (all labs ordered are listed, but only abnormal results are displayed) Labs Reviewed - No data to display  EKG   Radiology No results found.  Procedures Procedures (including critical care time)  Medications Ordered in UC Medications - No data to display  Initial Impression / Assessment and Plan / UC Course  I have reviewed the triage vital signs and the nursing notes.  L OM  I placed her on Augmentin as noted and advised to pick up the Flonase.  See instructions  Final Clinical Impressions(s) / UC Diagnoses   Final diagnoses:  Acute otitis media, left     Discharge Instructions      Fill the Flonase prescribed to you on 11/29 and use it for 7 days along with the antibiotic You may take Tylenol or Ibuprofe up to 800  mg of the ibuprofen and 1000 mg of the tylenol every 8 hours for pain.     ED Prescriptions     Medication Sig Dispense Auth. Provider   amoxicillin-clavulanate (AUGMENTIN) 875-125 MG tablet Take 1 tablet by mouth every 12 (twelve) hours. 14 tablet  Rodriguez-Southworth, Nettie Elm, PA-C      PDMP not reviewed this encounter.   Garey Ham, PA-C 02/09/22 1349

## 2022-02-10 ENCOUNTER — Ambulatory Visit: Payer: Medicaid Other

## 2022-02-21 ENCOUNTER — Inpatient Hospital Stay
Admission: RE | Admit: 2022-02-21 | Discharge: 2022-02-21 | Disposition: A | Discharge status: Home / Self Care | Payer: Medicaid Other | Source: Ambulatory Visit

## 2022-02-23 ENCOUNTER — Ambulatory Visit: Payer: Medicaid Other

## 2022-02-24 ENCOUNTER — Ambulatory Visit
Admission: RE | Admit: 2022-02-24 | Discharge: 2022-02-24 | Disposition: A | Discharge status: Home / Self Care | Payer: Medicaid Other | Source: Ambulatory Visit | Attending: Physician Assistant | Admitting: Physician Assistant

## 2022-02-24 VITALS — BP 123/84 | HR 85 | Temp 97.9°F | Resp 18 | Ht 66.0 in | Wt 162.0 lb

## 2022-02-24 DIAGNOSIS — H6692 Otitis media, unspecified, left ear: Secondary | ICD-10-CM | POA: Diagnosis not present

## 2022-02-24 MED ORDER — FLUTICASONE PROPIONATE 50 MCG/ACT NA SUSP: 1.0000 | Freq: Every day | NASAL | 2 refills | Status: AC

## 2022-02-24 MED ORDER — AZITHROMYCIN 250 MG PO TABS: ORAL_TABLET | ORAL | 0 refills | Status: AC

## 2022-02-24 NOTE — ED Triage Notes (Signed)
Pt c/o left ear issue x3weeks  Pt was given amoxicillin and flonase. Pt states that she has taken the amoxicillin incorrectly and still has an ear infection.

## 2022-02-24 NOTE — ED Provider Notes (Signed)
[facility]    CSN: [id number] Arrival date & time: 02/24/22  1054      History   Chief Complaint Chief Complaint  Patient presents with   Ear Problem    Appt @ 11    HPI Tamara Barron is a 26 y.o. female.   Patient is a 26 year old female who presents with continued symptoms from her left ear infection.  Patient initially seen November 29 with upper respiratory symptoms and congestion.  At that time she had bilateral ear effusions but no signs of overt infection.  She was given recommendation for Flonase and Zyrtec.  Patient came back on 4 December with complaint of left ear pain.  She was diagnosed with left ear infection.  She was advised to start taking the Flonase that she was prescribed previously and was given a prescription for Augmentin to be taken twice a day.  Patient states that she was actually taking Augmentin every 24 hours due to being busy with work and her children and states that was well into the course that she noted on the bottle she is supposed to take every 12 hours instead of every 24.  Patient states she has been using the Flonase and that her other upper respiratory symptoms of her congestion and drainage have improved.    Past Medical History:  Diagnosis Date   COVID-19 07/16/2020   Epilepsy (HCC)    Last seizure 6 months ago. Self-discontinued Keppra due to cost.   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 Authorizing Provider  azithromycin (ZITHROMAX Z-PAK) 250 MG tablet Take 2 tablets by mouth the first day followed by one tablet daily for next 4 days. 02/24/22  Yes Candis Schatz, PA-C  cetirizine (ZYRTEC) 10 MG tablet Take 1 tablet (10 mg total) by mouth daily. 02/04/22  Yes Candis Schatz, PA-C  fluticasone (FLONASE) 50 MCG/ACT nasal spray Place 1 spray into both nostrils daily. 02/24/22  Yes Candis Schatz, PA-C  ondansetron (ZOFRAN-ODT) 4 MG disintegrating tablet Take 1 tablet (4 mg total) by mouth every 8 (eight) hours as needed for nausea or vomiting. 02/04/22  Yes Candis Schatz, PA-C  ferrous sulfate 325 (65 FE) MG tablet Take 325 mg by mouth daily with breakfast.  09/08/19  [provider]  norethindrone (MICRONOR) 0.35 MG tablet Take 1 tablet (0.35 mg total) by mouth daily. Start 4 wks postpartum Patient not taking: Reported on 05/10/2019 05/03/19 09/08/19  Janee Morn,  Pattricia Boss, CNM    Family History Family History  Problem Relation Age of Onset   Healthy Mother    Healthy Father     Social History Social History   Tobacco Use   Smoking status: Every Day    Types: Cigarettes   Smokeless tobacco: Never  Vaping Use   Vaping Use: Former  Substance Use Topics   Alcohol use: Yes    Alcohol/week: 5.0 standard drinks of alcohol    Types: 5 Cans of beer per week    Comment: twice weekly   Drug use: Not Currently    Types: Marijuana     Allergies   Oxycodone hcl   Review of Systems Review of Systems as noted above in HPI.  Other systems reviewed and found to be negative   Physical Exam Triage Vital Signs ED Triage Vitals  Enc Vitals Group     BP 02/24/22 1126 123/84     Pulse Rate 02/24/22 1126 85     Resp 02/24/22 1126 18     Temp 02/24/22 1126 97.9 F (36.6 C)     Temp Source 02/24/22 1126 Oral     SpO2 02/24/22 1126  100 %     Weight 02/24/22 1124 162 lb (73.5 kg)     Height 02/24/22 1124 5\' 6"  (1.676 m)     Head Circumference --      Peak Flow --      Pain Score 02/24/22 1124 0     Pain Loc --      Pain Edu? --      Excl. in GC? --    No data found.  Updated Vital Signs BP 123/84 (BP Location: Left Arm)   Pulse 85   Temp 97.9 F (36.6 C) (Oral)   Resp 18   Ht 5\' 6"  (1.676 m)   Wt 162 lb (73.5 kg)   LMP 02/22/2022   SpO2 100%   BMI 26.15 kg/m   Visual Acuity Right Eye Distance:   Left Eye Distance:   Bilateral Distance:    Right Eye Near:   Left Eye Near:    Bilateral Near:     Physical Exam Constitutional:      Appearance: Normal appearance.  HENT:     Right Ear: Ear canal normal. A middle ear effusion is present. Tympanic membrane is not erythematous.     Left Ear: Ear canal normal. A middle ear effusion is present. Tympanic membrane is erythematous.     Nose: Nose normal.     Mouth/Throat:     Mouth: Mucous membranes are moist.  Cardiovascular:     Rate and Rhythm: Normal rate.  Pulmonary:     Effort: Pulmonary effort is normal.  Neurological:     Mental Status: She is alert.      UC Treatments / Results  Labs (all labs ordered are listed, but only abnormal results are displayed) Labs Reviewed - No data to display  EKG   Radiology No results found.  Procedures Procedures (including critical care time)  Medications Ordered in UC Medications - No data to display  Initial Impression / Assessment and Plan / UC Course  I have reviewed the triage vital signs and the nursing notes.  Pertinent labs & imaging results that were available during my care of the patient were reviewed by me and considered in my medical decision making (see chart for details).    Patient returns with concern for continued left ear infection.  She reports still  muffled on the left side but states the pain is better.  Her other symptoms of congestion and drainage that she was seen for  previously have resolved.  She has been taking the Flonase.  She reports she was taking the Augmentin every 24 hours instead of every 12 hours.  She does have fluid in both ears but only erythema to the left.  Will refill her prescription for the Flonase.  Again recommend the use Zyrtec at night.  And then I will change her to azithromycin. Final Clinical Impressions(s) / UC Diagnoses   Final diagnoses:  Left otitis media, unspecified otitis media type     Discharge Instructions      -Azithromycin: Take as prescribed on packaging -Flonase 1 spray to nostrils daily -Would recommend Zyrtec or other oral allergy medication to be taken at night -ibuprofen and Tylenol as needed for pain and fever     ED Prescriptions     Medication Sig Dispense Auth. Provider   fluticasone (FLONASE) 50 MCG/ACT nasal spray Place 1 spray into both nostrils daily. 16 g Candis Schatz, PA-C   azithromycin (ZITHROMAX Z-PAK) 250 MG tablet Take 2 tablets by mouth the first day followed by one tablet daily for next 4 days. 6 tablet Candis Schatz, PA-C      PDMP not reviewed this encounter.   Candis Schatz, PA-C 02/24/22 1154

## 2022-02-24 NOTE — Discharge Instructions (Addendum)
-  Azithromycin: Take as prescribed on packaging -Flonase 1 spray to nostrils daily -Would recommend Zyrtec or other oral allergy medication to be taken at night -ibuprofen and Tylenol as needed for pain and fever

## 2022-03-09 DIAGNOSIS — Z419 Encounter for procedure for purposes other than remedying health state, unspecified: Secondary | ICD-10-CM | POA: Diagnosis not present

## 2022-04-09 DIAGNOSIS — Z419 Encounter for procedure for purposes other than remedying health state, unspecified: Secondary | ICD-10-CM | POA: Diagnosis not present

## 2022-05-08 DIAGNOSIS — Z419 Encounter for procedure for purposes other than remedying health state, unspecified: Secondary | ICD-10-CM | POA: Diagnosis not present

## 2022-06-08 DIAGNOSIS — Z419 Encounter for procedure for purposes other than remedying health state, unspecified: Secondary | ICD-10-CM | POA: Diagnosis not present

## 2022-07-08 DIAGNOSIS — Z419 Encounter for procedure for purposes other than remedying health state, unspecified: Secondary | ICD-10-CM | POA: Diagnosis not present

## 2022-07-31 DIAGNOSIS — Z3202 Encounter for pregnancy test, result negative: Secondary | ICD-10-CM | POA: Diagnosis not present

## 2022-07-31 DIAGNOSIS — N926 Irregular menstruation, unspecified: Secondary | ICD-10-CM | POA: Diagnosis not present

## 2022-08-08 DIAGNOSIS — Z419 Encounter for procedure for purposes other than remedying health state, unspecified: Secondary | ICD-10-CM | POA: Diagnosis not present

## 2022-09-07 DIAGNOSIS — Z419 Encounter for procedure for purposes other than remedying health state, unspecified: Secondary | ICD-10-CM | POA: Diagnosis not present

## 2022-09-16 ENCOUNTER — Telehealth: Payer: Self-pay

## 2022-09-16 NOTE — Telephone Encounter (Signed)
Chart review completed for patient. Patient is due for cervical cancer screen. Patient does not have PCP listed, but Mychart message sent to patient to inquire about scheduling.  Elijio Miles, Population Health Specialist.

## 2022-10-08 DIAGNOSIS — Z419 Encounter for procedure for purposes other than remedying health state, unspecified: Secondary | ICD-10-CM | POA: Diagnosis not present

## 2022-10-27 DIAGNOSIS — Z23 Encounter for immunization: Secondary | ICD-10-CM | POA: Diagnosis not present

## 2022-10-27 DIAGNOSIS — S61411A Laceration without foreign body of right hand, initial encounter: Secondary | ICD-10-CM | POA: Diagnosis not present

## 2022-10-27 DIAGNOSIS — S61219A Laceration without foreign body of unspecified finger without damage to nail, initial encounter: Secondary | ICD-10-CM | POA: Diagnosis not present

## 2022-10-29 DIAGNOSIS — L03011 Cellulitis of right finger: Secondary | ICD-10-CM | POA: Diagnosis not present

## 2022-10-29 DIAGNOSIS — S61216D Laceration without foreign body of right little finger without damage to nail, subsequent encounter: Secondary | ICD-10-CM | POA: Diagnosis not present

## 2022-10-29 DIAGNOSIS — S61214D Laceration without foreign body of right ring finger without damage to nail, subsequent encounter: Secondary | ICD-10-CM | POA: Diagnosis not present

## 2022-10-30 DIAGNOSIS — M7989 Other specified soft tissue disorders: Secondary | ICD-10-CM | POA: Diagnosis not present

## 2022-10-30 DIAGNOSIS — M79671 Pain in right foot: Secondary | ICD-10-CM | POA: Diagnosis not present

## 2022-10-30 DIAGNOSIS — M79641 Pain in right hand: Secondary | ICD-10-CM | POA: Diagnosis not present

## 2022-11-05 DIAGNOSIS — S61209A Unspecified open wound of unspecified finger without damage to nail, initial encounter: Secondary | ICD-10-CM | POA: Diagnosis not present

## 2022-11-05 DIAGNOSIS — M79641 Pain in right hand: Secondary | ICD-10-CM | POA: Diagnosis not present

## 2022-11-05 DIAGNOSIS — S56129A Laceration of flexor muscle, fascia and tendon of unspecified finger at forearm level, initial encounter: Secondary | ICD-10-CM | POA: Diagnosis not present

## 2022-11-06 DIAGNOSIS — Z79899 Other long term (current) drug therapy: Secondary | ICD-10-CM | POA: Diagnosis not present

## 2022-11-06 DIAGNOSIS — S61216A Laceration without foreign body of right little finger without damage to nail, initial encounter: Secondary | ICD-10-CM | POA: Diagnosis not present

## 2022-11-06 DIAGNOSIS — S56127A Laceration of flexor muscle, fascia and tendon of right little finger at forearm level, initial encounter: Secondary | ICD-10-CM | POA: Diagnosis not present

## 2022-11-08 DIAGNOSIS — Z419 Encounter for procedure for purposes other than remedying health state, unspecified: Secondary | ICD-10-CM | POA: Diagnosis not present

## 2022-11-10 DIAGNOSIS — S99921A Unspecified injury of right foot, initial encounter: Secondary | ICD-10-CM | POA: Diagnosis not present

## 2022-11-12 DIAGNOSIS — S56129A Laceration of flexor muscle, fascia and tendon of unspecified finger at forearm level, initial encounter: Secondary | ICD-10-CM | POA: Diagnosis not present

## 2022-11-12 DIAGNOSIS — S61209A Unspecified open wound of unspecified finger without damage to nail, initial encounter: Secondary | ICD-10-CM | POA: Diagnosis not present

## 2022-11-26 DIAGNOSIS — S56129A Laceration of flexor muscle, fascia and tendon of unspecified finger at forearm level, initial encounter: Secondary | ICD-10-CM | POA: Diagnosis not present

## 2022-11-26 DIAGNOSIS — S61209A Unspecified open wound of unspecified finger without damage to nail, initial encounter: Secondary | ICD-10-CM | POA: Diagnosis not present

## 2022-12-08 DIAGNOSIS — Z419 Encounter for procedure for purposes other than remedying health state, unspecified: Secondary | ICD-10-CM | POA: Diagnosis not present

## 2022-12-10 DIAGNOSIS — S56129A Laceration of flexor muscle, fascia and tendon of unspecified finger at forearm level, initial encounter: Secondary | ICD-10-CM | POA: Diagnosis not present

## 2022-12-10 DIAGNOSIS — S61209A Unspecified open wound of unspecified finger without damage to nail, initial encounter: Secondary | ICD-10-CM | POA: Diagnosis not present

## 2022-12-11 DIAGNOSIS — S99921A Unspecified injury of right foot, initial encounter: Secondary | ICD-10-CM | POA: Diagnosis not present

## 2022-12-31 DIAGNOSIS — S61209A Unspecified open wound of unspecified finger without damage to nail, initial encounter: Secondary | ICD-10-CM | POA: Diagnosis not present

## 2022-12-31 DIAGNOSIS — S56129A Laceration of flexor muscle, fascia and tendon of unspecified finger at forearm level, initial encounter: Secondary | ICD-10-CM | POA: Diagnosis not present

## 2023-01-02 DIAGNOSIS — R509 Fever, unspecified: Secondary | ICD-10-CM | POA: Diagnosis not present

## 2023-01-02 DIAGNOSIS — R197 Diarrhea, unspecified: Secondary | ICD-10-CM | POA: Diagnosis not present

## 2023-01-02 DIAGNOSIS — R051 Acute cough: Secondary | ICD-10-CM | POA: Diagnosis not present

## 2023-01-02 DIAGNOSIS — R0981 Nasal congestion: Secondary | ICD-10-CM | POA: Diagnosis not present

## 2023-01-08 DIAGNOSIS — Z419 Encounter for procedure for purposes other than remedying health state, unspecified: Secondary | ICD-10-CM | POA: Diagnosis not present

## 2023-02-07 DIAGNOSIS — Z419 Encounter for procedure for purposes other than remedying health state, unspecified: Secondary | ICD-10-CM | POA: Diagnosis not present

## 2023-03-10 DIAGNOSIS — Z419 Encounter for procedure for purposes other than remedying health state, unspecified: Secondary | ICD-10-CM | POA: Diagnosis not present

## 2023-03-15 DIAGNOSIS — J101 Influenza due to other identified influenza virus with other respiratory manifestations: Secondary | ICD-10-CM | POA: Diagnosis not present

## 2023-03-15 DIAGNOSIS — H9203 Otalgia, bilateral: Secondary | ICD-10-CM | POA: Diagnosis not present

## 2023-03-15 DIAGNOSIS — J329 Chronic sinusitis, unspecified: Secondary | ICD-10-CM | POA: Diagnosis not present

## 2023-03-15 DIAGNOSIS — R051 Acute cough: Secondary | ICD-10-CM | POA: Diagnosis not present

## 2023-04-03 DIAGNOSIS — Z202 Contact with and (suspected) exposure to infections with a predominantly sexual mode of transmission: Secondary | ICD-10-CM | POA: Diagnosis not present

## 2023-04-04 DIAGNOSIS — Z202 Contact with and (suspected) exposure to infections with a predominantly sexual mode of transmission: Secondary | ICD-10-CM | POA: Diagnosis not present

## 2023-04-10 DIAGNOSIS — Z419 Encounter for procedure for purposes other than remedying health state, unspecified: Secondary | ICD-10-CM | POA: Diagnosis not present

## 2023-04-27 DIAGNOSIS — R52 Pain, unspecified: Secondary | ICD-10-CM | POA: Diagnosis not present

## 2023-04-27 DIAGNOSIS — U071 COVID-19: Secondary | ICD-10-CM | POA: Diagnosis not present

## 2023-05-08 DIAGNOSIS — Z419 Encounter for procedure for purposes other than remedying health state, unspecified: Secondary | ICD-10-CM | POA: Diagnosis not present

## 2023-05-30 IMAGING — US US ABDOMEN LIMITED
1 series · 14 of 25 positions shown · non-contrast
Comparison: None.

CLINICAL DATA: Right upper quadrant abdominal pain, nausea/vomiting

EXAM:
ULTRASOUND ABDOMEN LIMITED RIGHT UPPER QUADRANT

[Series 1: us abdomen limited ruq (liver/gb) · 14 of 41 slices shown]
[im 1/41]
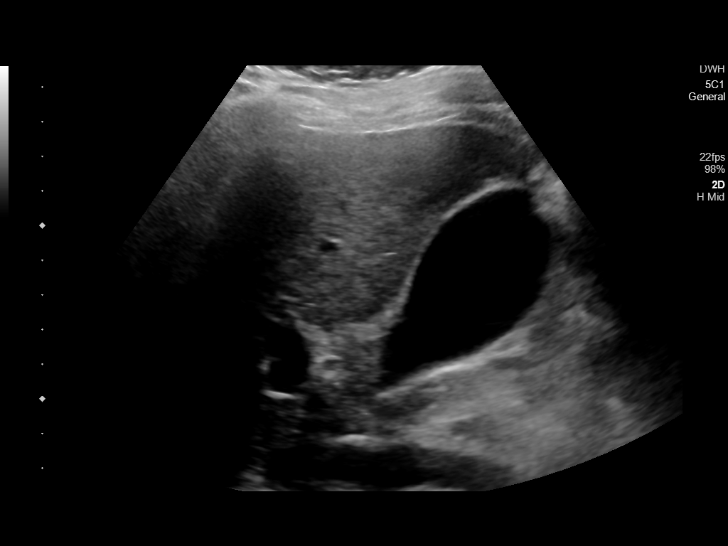
[im 4/41]
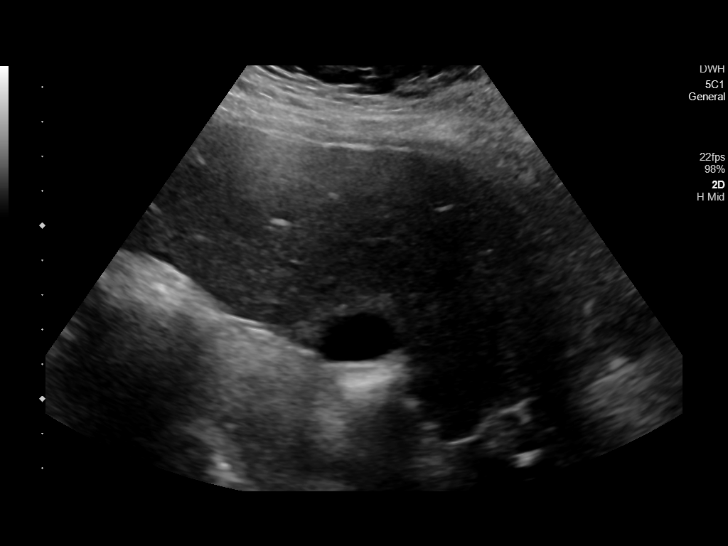
[im 7/41]
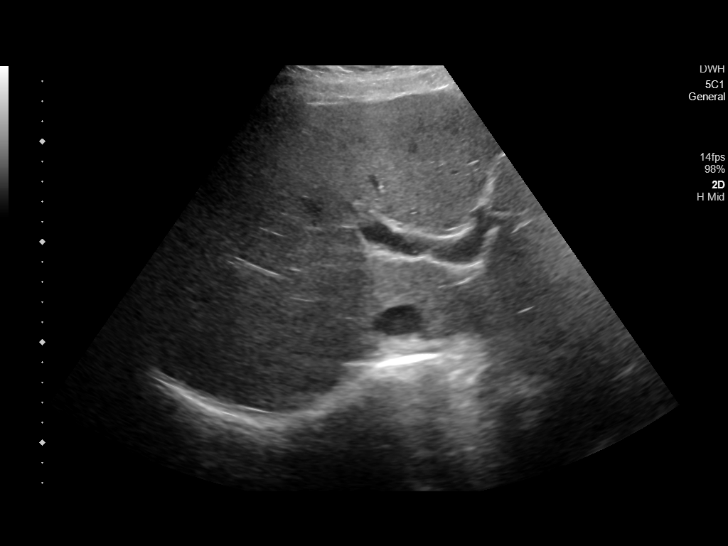
[im 11/41]
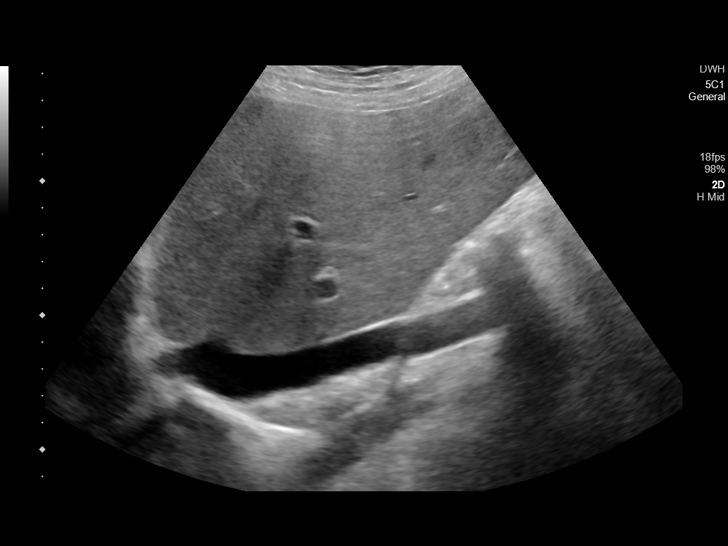
[im 14/41]
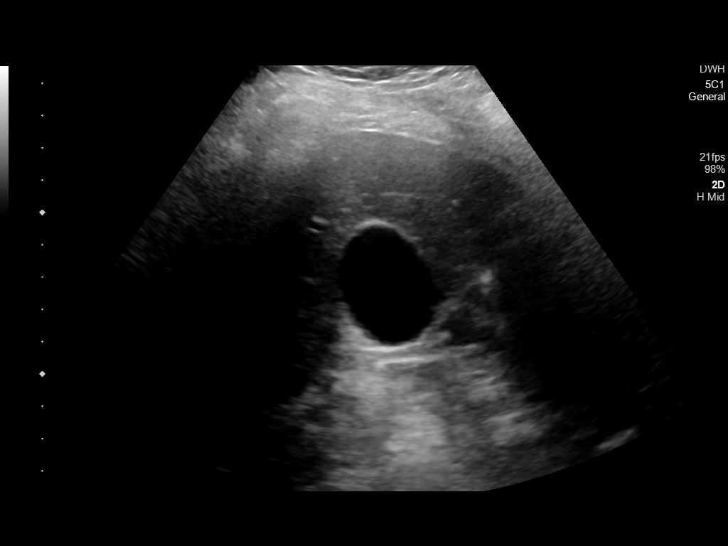
[im 16/41]
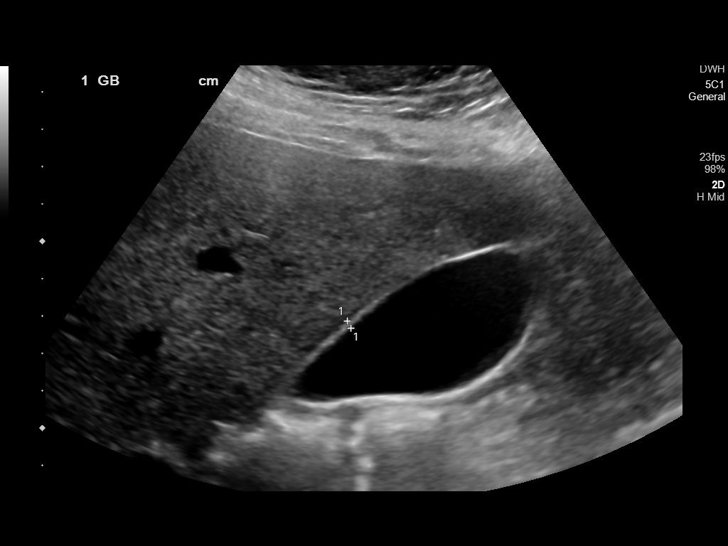
[im 19/41]
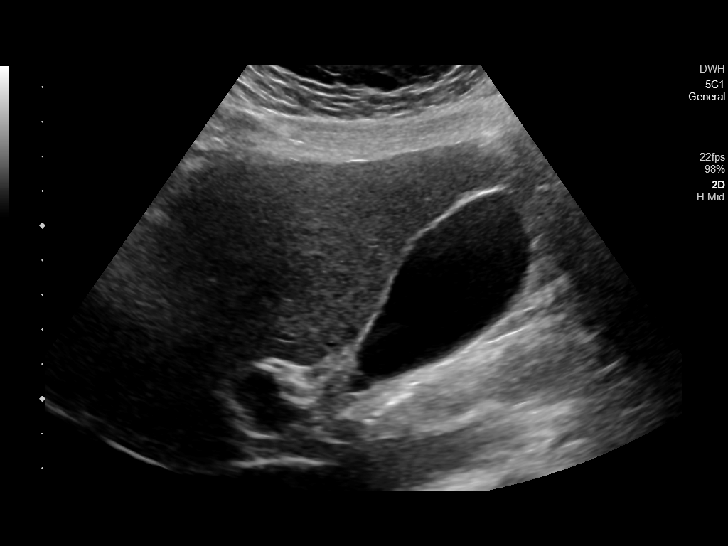
[im 22/41]
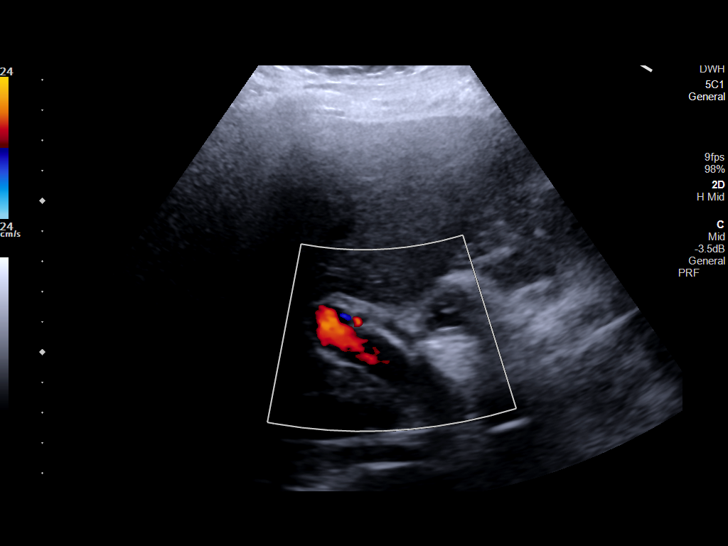
[im 26/41]
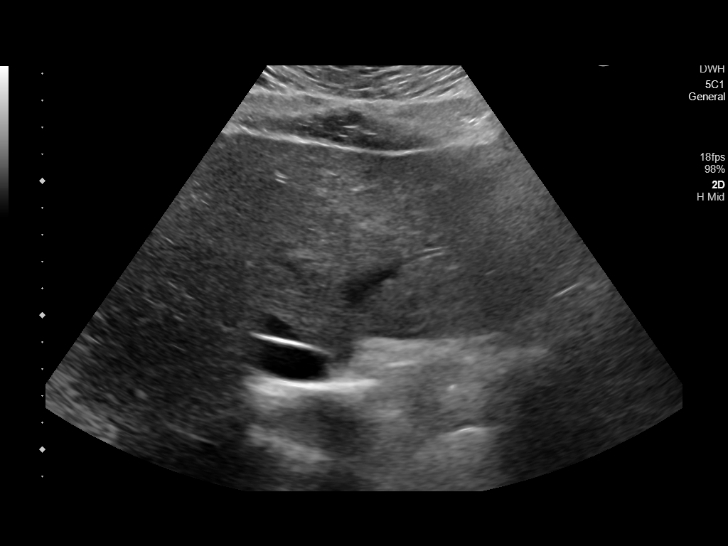
[im 27/41]
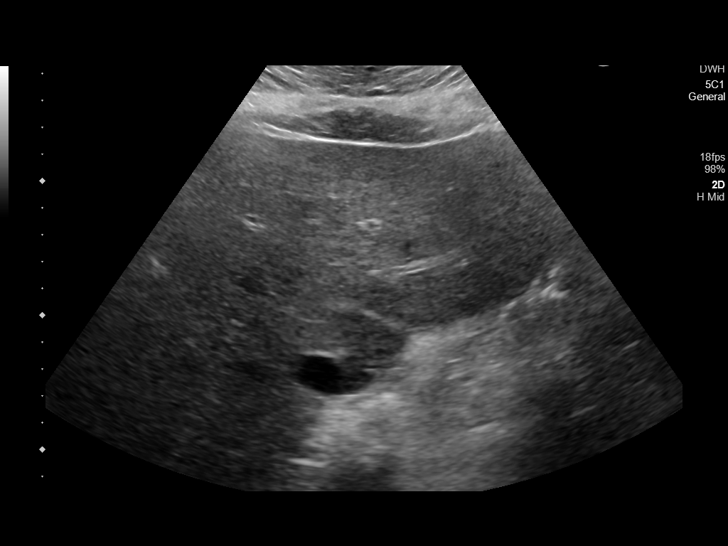
[im 31/41]
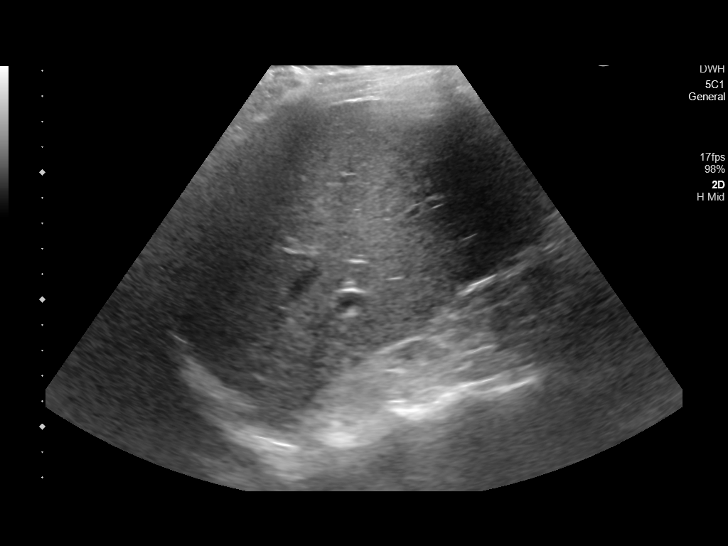
[im 34/41]
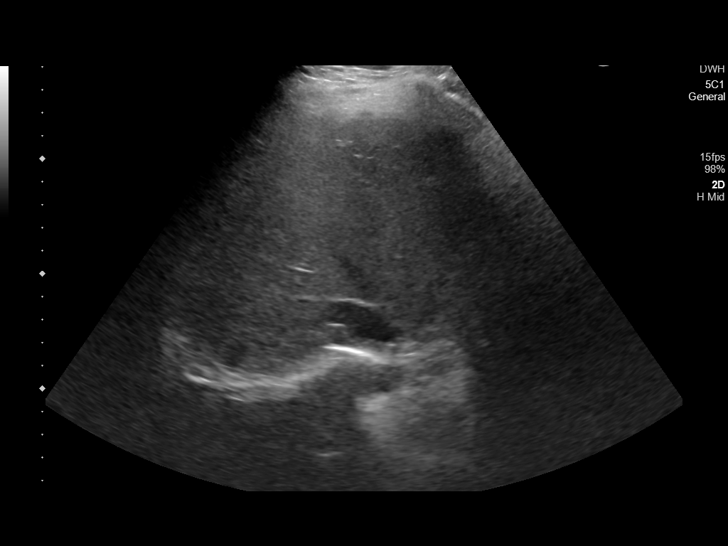
[im 37/41]
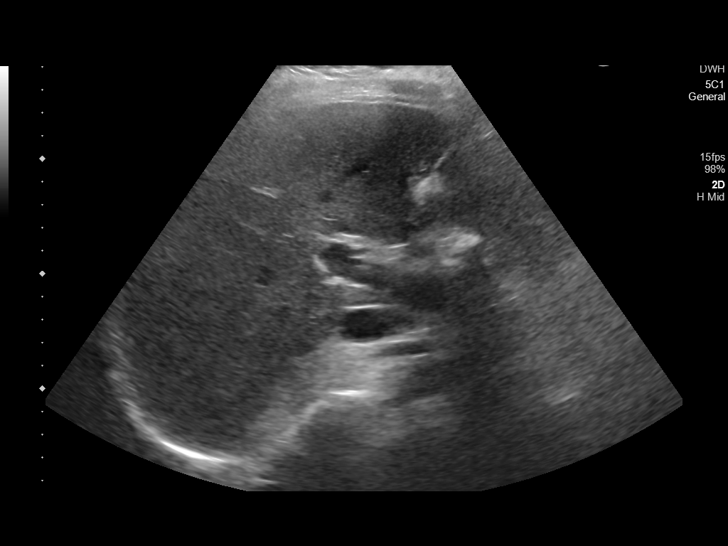
[im 41/41]
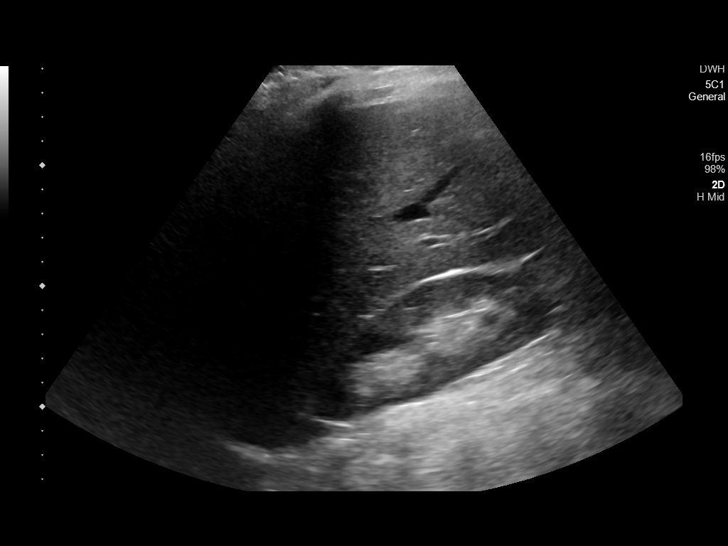

[14 of 25 positions shown; findings below may reference images not displayed]

FINDINGS: Gallbladder:

No gallstones, gallbladder wall thickening, or pericholecystic
fluid. Negative sonographic Murphy's sign.

Common bile duct:

Diameter: 4 mm

Liver:

Hyperechoic hepatic parenchyma, suggesting hepatic steatosis. No
focal hepatic lesion is seen. Portal vein is patent on color Doppler
imaging with normal direction of blood flow towards the liver.

Other: None.
IMPRESSION: Suspected hepatic steatosis. Otherwise negative right upper quadrant
ultrasound.

## 2023-06-19 DIAGNOSIS — Z419 Encounter for procedure for purposes other than remedying health state, unspecified: Secondary | ICD-10-CM | POA: Diagnosis not present

## 2023-07-19 DIAGNOSIS — Z419 Encounter for procedure for purposes other than remedying health state, unspecified: Secondary | ICD-10-CM | POA: Diagnosis not present

## 2023-08-19 DIAGNOSIS — Z419 Encounter for procedure for purposes other than remedying health state, unspecified: Secondary | ICD-10-CM | POA: Diagnosis not present
